# Patient Record
Sex: Male | Born: 1935 | Race: White | Hispanic: No | Smoking: Former smoker
Health system: Southern US, Community
[De-identification: ages and names within clinical notes are randomized; demographics above are authoritative.]

## PROBLEM LIST (undated history)

## (undated) DIAGNOSIS — E785 Hyperlipidemia, unspecified: Secondary | ICD-10-CM

## (undated) DIAGNOSIS — I2510 Atherosclerotic heart disease of native coronary artery without angina pectoris: Secondary | ICD-10-CM

## (undated) DIAGNOSIS — I00 Rheumatic fever without heart involvement: Secondary | ICD-10-CM

## (undated) DIAGNOSIS — C649 Malignant neoplasm of unspecified kidney, except renal pelvis: Secondary | ICD-10-CM

## (undated) DIAGNOSIS — R011 Cardiac murmur, unspecified: Secondary | ICD-10-CM

## (undated) DIAGNOSIS — I10 Essential (primary) hypertension: Secondary | ICD-10-CM

## (undated) DIAGNOSIS — N400 Enlarged prostate without lower urinary tract symptoms: Secondary | ICD-10-CM

## (undated) DIAGNOSIS — E46 Unspecified protein-calorie malnutrition: Secondary | ICD-10-CM

## (undated) DIAGNOSIS — Z8739 Personal history of other diseases of the musculoskeletal system and connective tissue: Secondary | ICD-10-CM

## (undated) DIAGNOSIS — R739 Hyperglycemia, unspecified: Secondary | ICD-10-CM

## (undated) DIAGNOSIS — E119 Type 2 diabetes mellitus without complications: Secondary | ICD-10-CM

## (undated) HISTORY — PX: VASECTOMY: SHX75

## (undated) HISTORY — DX: Essential (primary) hypertension: I10

## (undated) HISTORY — DX: Atherosclerotic heart disease of native coronary artery without angina pectoris: I25.10

## (undated) HISTORY — DX: Rheumatic fever without heart involvement: I00

## (undated) HISTORY — PX: TONSILLECTOMY AND ADENOIDECTOMY: SUR1326

## (undated) HISTORY — DX: Hyperlipidemia, unspecified: E78.5

## (undated) HISTORY — DX: Benign prostatic hyperplasia without lower urinary tract symptoms: N40.0

## (undated) HISTORY — DX: Malignant neoplasm of unspecified kidney, except renal pelvis: C64.9

## (undated) HISTORY — DX: Unspecified protein-calorie malnutrition: E46

## (undated) HISTORY — DX: Hyperglycemia, unspecified: R73.9

---

## 1989-01-17 HISTORY — PX: CORONARY ARTERY BYPASS GRAFT: SHX141

## 1989-01-17 HISTORY — PX: CARDIAC CATHETERIZATION: SHX172

## 1997-01-17 DIAGNOSIS — C649 Malignant neoplasm of unspecified kidney, except renal pelvis: Secondary | ICD-10-CM

## 1997-01-17 HISTORY — DX: Malignant neoplasm of unspecified kidney, except renal pelvis: C64.9

## 1997-01-17 HISTORY — PX: NEPHRECTOMY: SHX65

## 1997-12-30 ENCOUNTER — Inpatient Hospital Stay (HOSPITAL_COMMUNITY): Admission: RE | Admit: 1997-12-30 | Discharge: 1998-01-01 | Payer: Self-pay | Admitting: Urology

## 1998-09-18 HISTORY — PX: CATARACT EXTRACTION W/ INTRAOCULAR LENS  IMPLANT, BILATERAL: SHX1307

## 1999-06-23 ENCOUNTER — Encounter: Admission: RE | Admit: 1999-06-23 | Discharge: 1999-06-23 | Payer: Self-pay | Admitting: Urology

## 1999-06-23 ENCOUNTER — Encounter: Payer: Self-pay | Admitting: Urology

## 2000-06-19 ENCOUNTER — Encounter: Payer: Self-pay | Admitting: Urology

## 2000-06-19 ENCOUNTER — Encounter: Admission: RE | Admit: 2000-06-19 | Discharge: 2000-06-19 | Payer: Self-pay | Admitting: Urology

## 2001-06-18 ENCOUNTER — Encounter: Admission: RE | Admit: 2001-06-18 | Discharge: 2001-06-18 | Payer: Self-pay | Admitting: Urology

## 2001-06-18 ENCOUNTER — Encounter: Payer: Self-pay | Admitting: Urology

## 2002-07-04 ENCOUNTER — Encounter: Payer: Self-pay | Admitting: Urology

## 2002-07-04 ENCOUNTER — Encounter: Admission: RE | Admit: 2002-07-04 | Discharge: 2002-07-04 | Payer: Self-pay | Admitting: Urology

## 2004-10-01 ENCOUNTER — Ambulatory Visit: Payer: Self-pay | Admitting: Hematology & Oncology

## 2004-11-19 ENCOUNTER — Ambulatory Visit: Payer: Self-pay | Admitting: Hematology & Oncology

## 2008-01-18 HISTORY — PX: COLONOSCOPY: SHX174

## 2009-07-14 HISTORY — PX: US ECHOCARDIOGRAPHY: HXRAD669

## 2010-10-12 ENCOUNTER — Ambulatory Visit (INDEPENDENT_AMBULATORY_CARE_PROVIDER_SITE_OTHER): Payer: Medicare Other | Admitting: Family Medicine

## 2010-10-12 ENCOUNTER — Encounter: Payer: Self-pay | Admitting: Family Medicine

## 2010-10-12 VITALS — BP 128/82 | HR 74 | Temp 97.9°F | Ht 67.75 in | Wt 210.0 lb

## 2010-10-12 DIAGNOSIS — R739 Hyperglycemia, unspecified: Secondary | ICD-10-CM

## 2010-10-12 DIAGNOSIS — R7309 Other abnormal glucose: Secondary | ICD-10-CM

## 2010-10-12 DIAGNOSIS — I2510 Atherosclerotic heart disease of native coronary artery without angina pectoris: Secondary | ICD-10-CM

## 2010-10-12 DIAGNOSIS — I10 Essential (primary) hypertension: Secondary | ICD-10-CM

## 2010-10-12 DIAGNOSIS — E785 Hyperlipidemia, unspecified: Secondary | ICD-10-CM

## 2010-10-12 NOTE — Progress Notes (Signed)
  Subjective:    Patient ID: John Potter, male    DOB: Oct 06, 1934, 76 y.o.   MRN: [id number]  HPI 76 yr old male to establish with [place]. He had seen Dr. Renne Crigler in the past for primary care, but he has not seen him for about 5 years. He sees Dr. Rachelle Hora Croitoru twice a year for Cardiology exams. He sees Dr. Larey Dresser once a year for Urology exams. He sees Dr. Sherrie Mustache once a year at the [facility] clinic in [place] so he can get his prescriptions filled there. He feels fine today and he has no concerns. His most recent labs from 06-25-10 show a fasting glucose of 110.    Review of Systems  Constitutional: Negative.   Respiratory: Negative.   Cardiovascular: Negative.   Gastrointestinal: Negative.   Genitourinary: Negative.        Objective:   Physical Exam  Constitutional: He appears well-developed and well-nourished.  Neck: Neck supple. No thyromegaly present.  Cardiovascular: Normal rate, regular rhythm, normal heart sounds and intact distal pulses.  Exam reveals no gallop and no friction rub.        Has a 2/6 SM over the mitral area   Pulmonary/Chest: Effort normal and breath sounds normal. No respiratory distress. He has no wheezes. He has no rales. He exhibits no tenderness.  Lymphadenopathy:    He has no cervical adenopathy.          Assessment & Plan:  Intro visit for this patient. We will get records sent from his specialists.

## 2011-06-28 ENCOUNTER — Encounter: Payer: Self-pay | Admitting: Family Medicine

## 2011-06-28 ENCOUNTER — Ambulatory Visit (INDEPENDENT_AMBULATORY_CARE_PROVIDER_SITE_OTHER): Payer: Medicare Other | Admitting: Family Medicine

## 2011-06-28 VITALS — BP 142/70 | HR 65 | Temp 98.3°F | Wt 207.0 lb

## 2011-06-28 DIAGNOSIS — M109 Gout, unspecified: Secondary | ICD-10-CM

## 2011-06-28 MED ORDER — METHYLPREDNISOLONE ACETATE 80 MG/ML IJ SUSP
120.0000 mg | Freq: Once | INTRAMUSCULAR | Status: AC
  Administered 2011-06-28: 120 mg via INTRAMUSCULAR

## 2011-06-28 NOTE — Progress Notes (Signed)
Addended by: Aniceto Boss A on: 06/28/2011 10:11 AM   Modules accepted: Orders

## 2011-06-28 NOTE — Progress Notes (Signed)
  Subjective:    Patient ID: John Potter, male    DOB: [birth year], 77 y.o.   MRN: [id number]  HPI Here for the sudden onset 5 days ago of swelling and pain in the left foot. No recent trauma. He has never had this before. His brother has had gout for years.    Review of Systems  Constitutional: Negative.   Musculoskeletal: Positive for joint swelling and arthralgias.       Objective:   Physical Exam  Constitutional: He appears well-developed and well-nourished.  Musculoskeletal:       The left great toe is red, swollen, warm, and quite tender over the MTP          Assessment & Plan:  This is gout. We discussed its origin, how diet relates to it, etc. Recheck prn

## 2011-11-29 ENCOUNTER — Telehealth: Payer: Self-pay | Admitting: Family Medicine

## 2011-11-29 NOTE — Telephone Encounter (Signed)
That's fine. We will check it at his cpx

## 2011-11-29 NOTE — Telephone Encounter (Signed)
I spoke with pt  

## 2011-11-29 NOTE — Telephone Encounter (Signed)
Pt calling to schedule appt for annual cpx and states that his hemoglobin has been running low.  Apparently he went to donate blood twice this week and was turned away because hemoglobin was too low.  Pt states he is not have any symptoms and would like to wait until he comes in to see Dr. Clent Ridges for his cpx to discuss.

## 2012-01-16 ENCOUNTER — Other Ambulatory Visit (INDEPENDENT_AMBULATORY_CARE_PROVIDER_SITE_OTHER): Payer: Medicare Other

## 2012-01-16 DIAGNOSIS — D649 Anemia, unspecified: Secondary | ICD-10-CM

## 2012-01-16 DIAGNOSIS — Z0000 Encounter for general adult medical examination without abnormal findings: Secondary | ICD-10-CM

## 2012-01-16 LAB — BASIC METABOLIC PANEL
BUN: 24 mg/dL — ABNORMAL HIGH (ref 6–23)
CO2: 27 mEq/L (ref 19–32)
Calcium: 8.5 mg/dL (ref 8.4–10.5)
GFR: 52.01 mL/min — ABNORMAL LOW (ref >60.00)
Glucose, Bld: 123 mg/dL — ABNORMAL HIGH (ref 70–99)
Potassium: 4.3 mEq/L (ref 3.5–5.1)

## 2012-01-16 LAB — HEPATIC FUNCTION PANEL
AST: 21 U/L (ref 0–37)
Albumin: 3 g/dL — ABNORMAL LOW (ref 3.5–5.2)
Total Bilirubin: 0.4 mg/dL (ref 0.3–1.2)

## 2012-01-16 LAB — LIPID PANEL
Cholesterol: 120 mg/dL (ref 0–200)
HDL: 38.4 mg/dL — ABNORMAL LOW (ref >39.00)
LDL Cholesterol: 71 mg/dL (ref 0–99)
VLDL: 10.2 mg/dL (ref 0.0–40.0)

## 2012-01-16 LAB — POCT URINALYSIS DIPSTICK
Ketones, UA: NEGATIVE
Protein, UA: NEGATIVE
Spec Grav, UA: 1.01
pH, UA: 5.5

## 2012-01-16 LAB — TSH: TSH: 1.63 u[IU]/mL (ref 0.35–5.50)

## 2012-01-17 LAB — CBC WITH DIFFERENTIAL/PLATELET
Basophils Absolute: 0.1 10*3/uL (ref 0.0–0.1)
Eosinophils Absolute: 0.2 10*3/uL (ref 0.0–0.7)
HCT: 33 % — ABNORMAL LOW (ref 39.0–52.0)
Lymphs Abs: 1.7 10*3/uL (ref 0.7–4.0)
MCHC: 31.7 g/dL (ref 30.0–36.0)
Monocytes Absolute: 0.7 10*3/uL (ref 0.1–1.0)
Monocytes Relative: 8.6 % (ref 3.0–12.0)
Neutro Abs: 5 10*3/uL (ref 1.4–7.7)
Platelets: 233 10*3/uL (ref 150.0–400.0)
RDW: 17.1 % — ABNORMAL HIGH (ref 11.5–14.6)

## 2012-01-23 ENCOUNTER — Ambulatory Visit (INDEPENDENT_AMBULATORY_CARE_PROVIDER_SITE_OTHER): Payer: Medicare Other | Admitting: Family Medicine

## 2012-01-23 ENCOUNTER — Encounter: Payer: Self-pay | Admitting: Family Medicine

## 2012-01-23 VITALS — BP 142/76 | HR 73 | Temp 98.0°F | Ht 68.0 in | Wt 217.0 lb

## 2012-01-23 DIAGNOSIS — D649 Anemia, unspecified: Secondary | ICD-10-CM

## 2012-01-23 DIAGNOSIS — Z0000 Encounter for general adult medical examination without abnormal findings: Secondary | ICD-10-CM

## 2012-01-23 LAB — FERRITIN: Ferritin: 48.8 ng/mL (ref 22.0–322.0)

## 2012-01-23 LAB — IRON: Iron: 19 ug/dL — ABNORMAL LOW (ref 42–165)

## 2012-01-23 NOTE — Progress Notes (Signed)
  Subjective:    Patient ID: John Potter, male    DOB: January 30, 1934, 78 y.o.   MRN: [id number]  HPI 78 yr old male for a cpx. He feels well and has no concerns. However his recent labs show an anemia with a Hgb of 10 (down from 14 a year ago). The indices were normal but they were on the verge of being microcytic.    Review of Systems  Constitutional: Negative.   HENT: Negative.   Eyes: Negative.   Respiratory: Negative.   Cardiovascular: Negative.   Gastrointestinal: Negative.   Genitourinary: Negative.   Musculoskeletal: Negative.   Skin: Negative.   Neurological: Negative.   Hematological: Negative.   Psychiatric/Behavioral: Negative.        Objective:   Physical Exam  Constitutional: He is oriented to person, place, and time. He appears well-developed and well-nourished. No distress.  HENT:  Head: Normocephalic and atraumatic.  Right Ear: External ear normal.  Left Ear: External ear normal.  Nose: Nose normal.  Mouth/Throat: Oropharynx is clear and moist. No oropharyngeal exudate.  Eyes: Conjunctivae normal and EOM are normal. Pupils are equal, round, and reactive to light. Right eye exhibits no discharge. Left eye exhibits no discharge. No scleral icterus.  Neck: Neck supple. No JVD present. No tracheal deviation present. No thyromegaly present.  Cardiovascular: Normal rate, regular rhythm and intact distal pulses.  Exam reveals no gallop and no friction rub.   Murmur heard.      Stable 2/6 SM over the mitral area   Pulmonary/Chest: Effort normal and breath sounds normal. No respiratory distress. He has no wheezes. He has no rales. He exhibits no tenderness.  Abdominal: Soft. Bowel sounds are normal. He exhibits no distension and no mass. There is no tenderness. There is no rebound and no guarding.  Genitourinary: Rectum normal, prostate normal and penis normal. Guaiac negative stool. No penile tenderness.  Musculoskeletal: Normal range of motion. He exhibits no edema and  no tenderness.  Lymphadenopathy:    He has no cervical adenopathy.  Neurological: He is alert and oriented to person, place, and time. He has normal reflexes. No cranial nerve deficit. He exhibits normal muscle tone. Coordination normal.  Skin: Skin is warm and dry. No rash noted. He is not diaphoretic. No erythema. No pallor.  Psychiatric: He has a normal mood and affect. His behavior is normal. Judgment and thought content normal.          Assessment & Plan:  Well exam. His anemia is likely due to low iron so we will get some labs today and we will go from there.

## 2012-01-23 NOTE — Progress Notes (Signed)
Quick Note:  Pt is here now for CPE and we will go over then. ______

## 2012-01-26 ENCOUNTER — Encounter: Payer: Self-pay | Admitting: Family Medicine

## 2012-01-26 MED ORDER — FERROUS SULFATE 325 (65 FE) MG PO TABS: 325.0000 mg | ORAL_TABLET | Freq: Two times a day (BID) | ORAL | Status: DC

## 2012-01-26 NOTE — Addendum Note (Signed)
Addended by: Aniceto Boss A on: 01/26/2012 11:56 AM   Modules accepted: Orders

## 2012-01-26 NOTE — Progress Notes (Signed)
Quick Note:  I left voice message with results, put a copy in mail and sent script e-scribe. ______

## 2012-04-02 ENCOUNTER — Ambulatory Visit (INDEPENDENT_AMBULATORY_CARE_PROVIDER_SITE_OTHER): Payer: Medicare Other | Admitting: Family Medicine

## 2012-04-02 ENCOUNTER — Encounter: Payer: Self-pay | Admitting: Family Medicine

## 2012-04-02 VITALS — BP 142/70 | HR 66 | Temp 97.8°F | Wt 218.0 lb

## 2012-04-02 DIAGNOSIS — H9192 Unspecified hearing loss, left ear: Secondary | ICD-10-CM

## 2012-04-02 DIAGNOSIS — H9190 Unspecified hearing loss, unspecified ear: Secondary | ICD-10-CM

## 2012-04-02 DIAGNOSIS — D509 Iron deficiency anemia, unspecified: Secondary | ICD-10-CM

## 2012-04-02 LAB — CBC WITH DIFFERENTIAL/PLATELET
Basophils Relative: 0.8 % (ref 0.0–3.0)
Eosinophils Absolute: 0.1 10*3/uL (ref 0.0–0.7)
HCT: 35.3 % — ABNORMAL LOW (ref 39.0–52.0)
Lymphs Abs: 1.8 10*3/uL (ref 0.7–4.0)
MCHC: 31.9 g/dL (ref 30.0–36.0)
MCV: 77.4 fl — ABNORMAL LOW (ref 78.0–100.0)
Monocytes Absolute: 0.8 10*3/uL (ref 0.1–1.0)
Neutrophils Relative %: 66.8 % (ref 43.0–77.0)
RBC: 4.56 Mil/uL (ref 4.22–5.81)

## 2012-04-02 NOTE — Progress Notes (Signed)
  Subjective:    Patient ID: John Potter, male    DOB: 1934/03/17, 78 y.o.   MRN: [id number]  HPI Here for 2 things. First he has noticed the slow loss of hearing in the left ear for several months. No pain or ringing or dizziness. The right side seems fine. He has a family hx of hearing loss including his father and sister. Also he wants to check his anemia again. He has been taking iron supplements for 2 and 1/2 months now. He feels the same as always.   Review of Systems  Constitutional: Negative.   HENT: Positive for hearing loss. Negative for ear pain, congestion, sinus pressure, tinnitus and ear discharge.        Objective:   Physical Exam  Constitutional: He is oriented to person, place, and time. He appears well-developed and well-nourished.  HENT:  Head: Normocephalic and atraumatic.  Right Ear: External ear normal.  Left Ear: External ear normal.  Nose: Nose normal.  Mouth/Throat: Oropharynx is clear and moist.  Eyes: Conjunctivae are normal. Pupils are equal, round, and reactive to light.  Neck: Neck supple. No thyromegaly present.  Lymphadenopathy:    He has no cervical adenopathy.  Neurological: He is alert and oriented to person, place, and time.          Assessment & Plan:  He probably has sensorineural hearing loss. I advised him to see an audiologist for a full evaluation. We will check a CBC today.

## 2012-04-03 NOTE — Progress Notes (Signed)
Quick Note:  I spoke with pt ______ 

## 2012-04-09 ENCOUNTER — Encounter: Payer: Self-pay | Admitting: Family Medicine

## 2012-04-09 ENCOUNTER — Ambulatory Visit (INDEPENDENT_AMBULATORY_CARE_PROVIDER_SITE_OTHER): Payer: Medicare Other | Admitting: Family Medicine

## 2012-04-09 VITALS — BP 162/70 | HR 78 | Temp 99.0°F | Wt 221.0 lb

## 2012-04-09 DIAGNOSIS — R42 Dizziness and giddiness: Secondary | ICD-10-CM

## 2012-04-09 DIAGNOSIS — H9190 Unspecified hearing loss, unspecified ear: Secondary | ICD-10-CM

## 2012-04-09 DIAGNOSIS — H9192 Unspecified hearing loss, left ear: Secondary | ICD-10-CM

## 2012-04-09 MED ORDER — PREDNISONE 10 MG PO TABS: ORAL_TABLET | ORAL | Status: DC

## 2012-04-09 NOTE — Progress Notes (Signed)
  Subjective:    Patient ID: John Potter, male    DOB: Dec 14, 1934, 78 y.o.   MRN: [id number]  HPI Here with continued left sided hearing loss but now also with dizziness. This started over the weekend with intermittent sensations of the room spinning. No sinus symptoms.    Review of Systems  Constitutional: Negative.   HENT: Positive for hearing loss and tinnitus. Negative for ear pain, congestion, sinus pressure and ear discharge.   Eyes: Negative.   Neurological: Positive for dizziness. Negative for tremors, seizures, syncope, facial asymmetry, speech difficulty, weakness, light-headedness, numbness and headaches.       Objective:   Physical Exam  Constitutional: He is oriented to person, place, and time. He appears well-developed and well-nourished.  HENT:  Right Ear: External ear normal.  Left Ear: External ear normal.  Nose: Nose normal.  Mouth/Throat: Oropharynx is clear and moist.  Eyes: Conjunctivae are normal. Pupils are equal, round, and reactive to light.  Lymphadenopathy:    He has no cervical adenopathy.  Neurological: He is alert and oriented to person, place, and time.          Assessment & Plan:  Unilateral hearing loss now with vertigo, possible [place]ie[place]. Given a prednisone taper, and we will refer to ENT.

## 2012-06-29 ENCOUNTER — Other Ambulatory Visit: Payer: Self-pay | Admitting: *Deleted

## 2012-06-29 ENCOUNTER — Telehealth: Payer: Self-pay | Admitting: *Deleted

## 2012-06-29 DIAGNOSIS — E785 Hyperlipidemia, unspecified: Secondary | ICD-10-CM

## 2012-06-29 DIAGNOSIS — E782 Mixed hyperlipidemia: Secondary | ICD-10-CM

## 2012-06-29 DIAGNOSIS — Z79899 Other long term (current) drug therapy: Secondary | ICD-10-CM

## 2012-06-29 LAB — COMPREHENSIVE METABOLIC PANEL
ALT: 13 U/L (ref 0–53)
AST: 17 U/L (ref 0–37)
Albumin: 4.2 g/dL (ref 3.5–5.2)
CO2: 25 mEq/L (ref 19–32)
Calcium: 9.3 mg/dL (ref 8.4–10.5)
Chloride: 106 mEq/L (ref 96–112)
Creat: 1.4 mg/dL — ABNORMAL HIGH (ref 0.50–1.35)
Potassium: 5.1 mEq/L (ref 3.5–5.3)

## 2012-06-29 LAB — LIPID PANEL: Cholesterol: 123 mg/dL (ref 0–200)

## 2012-06-29 NOTE — Telephone Encounter (Signed)
Patient walk-in to office to get labs done before office visit CMP and Lipids

## 2012-06-30 ENCOUNTER — Encounter: Payer: Self-pay | Admitting: *Deleted

## 2012-07-02 ENCOUNTER — Telehealth: Payer: Self-pay | Admitting: *Deleted

## 2012-07-02 NOTE — Telephone Encounter (Signed)
LMOM of stable lab results.

## 2012-07-02 NOTE — Telephone Encounter (Signed)
Message copied by Vita Barley on Mon Jul 02, 2012  8:51 AM ------      Message from: Thurmon Fair      Created: Sat Jun 30, 2012  8:25 AM       Labs are in normal range except borderline kidney function (not a new problem) ------

## 2012-07-04 ENCOUNTER — Encounter: Payer: Self-pay | Admitting: Cardiovascular Disease

## 2012-07-05 ENCOUNTER — Ambulatory Visit (INDEPENDENT_AMBULATORY_CARE_PROVIDER_SITE_OTHER): Payer: Medicare Other | Admitting: Cardiovascular Disease

## 2012-07-05 VITALS — BP 134/70 | HR 54 | Resp 16 | Ht 69.0 in | Wt 228.3 lb

## 2012-07-05 DIAGNOSIS — I2510 Atherosclerotic heart disease of native coronary artery without angina pectoris: Secondary | ICD-10-CM

## 2012-07-05 DIAGNOSIS — I447 Left bundle-branch block, unspecified: Secondary | ICD-10-CM

## 2012-07-05 DIAGNOSIS — E785 Hyperlipidemia, unspecified: Secondary | ICD-10-CM

## 2012-07-05 DIAGNOSIS — R011 Cardiac murmur, unspecified: Secondary | ICD-10-CM

## 2012-07-05 DIAGNOSIS — E669 Obesity, unspecified: Secondary | ICD-10-CM

## 2012-07-05 DIAGNOSIS — I25810 Atherosclerosis of coronary artery bypass graft(s) without angina pectoris: Secondary | ICD-10-CM

## 2012-07-05 DIAGNOSIS — I10 Essential (primary) hypertension: Secondary | ICD-10-CM

## 2012-07-05 NOTE — Patient Instructions (Signed)
Your physician encouraged you to lose weight for better health. Your physician discussed the importance of regular exercise and recommended that you start or continue a regular exercise program for good health.  Your physician recommends that you schedule a follow-up appointment in: 1 year

## 2012-07-08 ENCOUNTER — Encounter: Payer: Self-pay | Admitting: Cardiovascular Disease

## 2012-07-08 DIAGNOSIS — E785 Hyperlipidemia, unspecified: Secondary | ICD-10-CM | POA: Insufficient documentation

## 2012-07-08 DIAGNOSIS — I447 Left bundle-branch block, unspecified: Secondary | ICD-10-CM | POA: Insufficient documentation

## 2012-07-08 DIAGNOSIS — I10 Essential (primary) hypertension: Secondary | ICD-10-CM | POA: Insufficient documentation

## 2012-07-08 DIAGNOSIS — E669 Obesity, unspecified: Secondary | ICD-10-CM | POA: Insufficient documentation

## 2012-07-08 DIAGNOSIS — R011 Cardiac murmur, unspecified: Secondary | ICD-10-CM | POA: Insufficient documentation

## 2012-07-08 DIAGNOSIS — I2510 Atherosclerotic heart disease of native coronary artery without angina pectoris: Secondary | ICD-10-CM | POA: Insufficient documentation

## 2012-07-08 NOTE — Assessment & Plan Note (Signed)
Well controlled 

## 2012-07-08 NOTE — Assessment & Plan Note (Signed)
Despite his active lifestyle he remains asymptomatic. He describes his angina on exertion in the past as a sensation of "something cold air into his lungs". This has not recurred since surgery he has preserved left ventricular systolic function with an ejection fraction of 55% by echo in 2011

## 2012-07-08 NOTE — Assessment & Plan Note (Signed)
He has gained weight since his previous appointment. He is encouraged to pay more attention to his diet and continue with more frequent physical activity.

## 2012-07-08 NOTE — Assessment & Plan Note (Signed)
Satisfactory lipid profile on current regimen. Note mild fasting hyperglycemia suggestive of a tendency to develop diabetes mellitus. Weight loss is strongly encouraged.

## 2012-07-08 NOTE — Assessment & Plan Note (Signed)
Together with a tendency to sinus bradycardia this is an marker of conduction system disease. Negative chronotropic agents such as beta blockers should be used with caution

## 2012-07-08 NOTE — Progress Notes (Signed)
Patient ID: John Potter   DOB: 1934/05/30, 78 y.o.   MRN: [id number]      Reason for office visit Coronary artery disease followup  John Potter has done well from a cardiovascular standpoint since his last visit a year ago. He also has adjusted to the loss of his wife and seems to have mostly come out of the grieving process. Unfortunately he has become more sedentary than he was a year ago. This has led to weight gain. He noticed this when he received notification of his upcoming appointment and recently has started walking again. Yesterday he walk for 2 miles in about 35 minutes and had no complaints of shortness of breath chest pain intermittent claudication for dizziness.  He donates blood on a routine basis but was turned down from donation recently because of anemia. After starting iron supplements his hemoglobin has rebounded and he is now able to donate blood again.    he brings a very detailed log of his heart rate and blood pressure. Typically his heart rate is between 53 and 70 beats per minute and his typical blood pressure is in the 120s to 130s over 50s to 60s.    Allergies  Allergen Reactions  . Amoxicillin     rash    Current Outpatient Prescriptions  Medication Sig Dispense Refill  . aspirin 81 MG tablet Take 81 mg by mouth daily.        . calcium carbonate (OS-CAL) 600 MG TABS Take 600 mg by mouth daily. Vit D 3       . ferrous sulfate 325 (65 FE) MG tablet Take 1 tablet (325 mg total) by mouth 2 (two) times daily.  60 tablet  11  . fish oil-omega-3 fatty acids 1000 MG capsule Take 2 g by mouth daily.        Marland Kitchen lisinopril (PRINIVIL,ZESTRIL) 40 MG tablet Take 40 mg by mouth daily.        . metoprolol tartrate (LOPRESSOR) 25 MG tablet Take 25 mg by mouth 2 (two) times daily.        . Multiple Vitamin (MULTIVITAMIN) tablet Take 1 tablet by mouth daily.        . Multiple Vitamins-Minerals (PRESERVISION/LUTEIN) CAPS Take by mouth daily. Take 2 capsules every day      .  niacin (NIASPAN) 500 MG CR tablet Take 500 mg by mouth at bedtime.        . simvastatin (ZOCOR) 20 MG tablet Take 20 mg by mouth at bedtime.        . Tamsulosin HCl (FLOMAX) 0.4 MG CAPS Take 0.4 mg by mouth daily.      . vitamin C (ASCORBIC ACID) 500 MG tablet Take 1,000 mg by mouth daily.       . predniSONE (DELTASONE) 10 MG tablet Take 4 pills a day for 4 days, then 3 a day for 4 days, then 2 a day for 4 days ,then 1 a day for 4 days, then stop  40 tablet  0   No current facility-administered medications for this visit.    Past Medical History  Diagnosis Date  . CAD (coronary artery disease)     sees Dr. Rachelle Hora Kamyla Potter   . Hypertension   . Hyperlipidemia   . Chronic kidney disease   . Rheumatic fever   . Kidney carcinoma   . Hyperglycemia   . BPH (benign prostatic hyperplasia)     sees Dr. Mena Potter     Past Surgical History  Procedure Laterality Date  . Coronary artery bypass graft  1991    3 way CABG per Dr. Andrey Potter   . Nephrectomy  1999    left kidney, per Dr. Larey Potter   . Vasectomy    . Tonsillectomy and adenoidectomy    . Colonoscopy  2010    clear, repeat in 10 yrs   . US echocardiography  07/14/2009    mod-severe LVH,EF =>55%,LA mildly dilated,mild mitral & aortic annular ca+, AOV mod. sclerotic,discrete nodular thickening of the non-coronary cusp    Family History  Problem Relation Age of Onset  . Heart disease Father   . Hyperlipidemia Father   . Hypertension Father   . Diabetes Father     History   Social History  . Marital Status: Married    Spouse Name: N/A    Number of Children: N/A  . Years of Education: N/A   Occupational History  . Not on file.   Social History Main Topics  . Smoking status: Never Smoker   . Smokeless tobacco: Never Used  . Alcohol Use: No  . Drug Use: No  . Sexually Active: Not on file   Other Topics Concern  . Not on file   Social History Narrative  . No narrative on file    Review of systems: The patient  specifically denies any chest pain at rest or with exertion, dyspnea at rest or with exertion, orthopnea, paroxysmal nocturnal dyspnea, syncope, palpitations, focal neurological deficits, intermittent claudication, lower extremity edema, unexplained weight gain, cough, hemoptysis or wheezing.  The patient also denies abdominal pain, nausea, vomiting, dysphagia, diarrhea, constipation, polyuria, polydipsia, dysuria, hematuria, frequency, urgency, abnormal bleeding or bruising, fever, chills, unexpected weight changes, mood swings, change in skin or hair texture, change in voice quality, auditory or visual problems, allergic reactions or rashes, new musculoskeletal complaints other than usual "aches and pains".   PHYSICAL EXAM BP 134/70  Pulse 54  Resp 16  Ht 5\' 9"  (1.753 m)  Wt 228 lb 4.8 oz (103.556 kg)  BMI 33.7 kg/m2  General: Alert, oriented x3, no distress Head: no evidence of trauma, PERRL, EOMI, no exophtalmos or lid lag, no myxedema, no xanthelasma; normal ears, nose and oropharynx Neck: normal jugular venous pulsations and no hepatojugular reflux; brisk carotid pulses without delay and no carotid bruits Chest: clear to auscultation, no signs of consolidation by percussion or palpation, normal fremitus, symmetrical and full respiratory excursions Cardiovascular: normal position and quality of the apical impulse, regular rhythm, normal first and paradoxically split second heart sounds, grade 2-3/6 early peaking crescendo decrescendo systolic murmur in the aortic focus, rubs or gallops Abdomen: no tenderness or distention, no masses by palpation, no abnormal pulsatility or arterial bruits, normal bowel sounds, no hepatosplenomegaly Extremities: no clubbing, cyanosis or edema; 2+ radial, ulnar and brachial pulses bilaterally; 2+ right femoral, posterior tibial and dorsalis pedis pulses; 2+ left femoral, posterior tibial and dorsalis pedis pulses; no subclavian or femoral bruits Neurological:  grossly nonfocal   EKG: Sinus bradycardia left bundle branch block  Lipid Panel     Component Value Date/Time   CHOL 123 06/29/2012 0818   TRIG 103 06/29/2012 0818   HDL 39* 06/29/2012 0818   CHOLHDL 3.2 06/29/2012 0818   VLDL 21 06/29/2012 0818   LDLCALC 63 06/29/2012 0818    BMET    Component Value Date/Time   NA 145 06/29/2012 0818   K 5.1 06/29/2012 0818   CL 106 06/29/2012 0818   CO2 25 06/29/2012 0818  GLUCOSE 118* 06/29/2012 0818   BUN 28* 06/29/2012 0818   CREATININE 1.40* 06/29/2012 0818   CREATININE 1.4 01/16/2012 0809   CALCIUM 9.3 06/29/2012 0818     ASSESSMENT AND PLAN CAD (coronary artery disease) Despite his active lifestyle he remains asymptomatic. He describes his angina on exertion in the past as a sensation of "something cold air into his lungs". This has not recurred since surgery he has preserved left ventricular systolic function with an ejection fraction of 55% by echo in 2011  Essential hypertension Well-controlled  LBBB (left bundle branch block) Together with a tendency to sinus bradycardia this is an marker of conduction system disease. Negative chronotropic agents such as beta blockers should be used with caution  Obesity (BMI 30-39.9) He has gained weight since his previous appointment. He is encouraged to pay more attention to his diet and continue with more frequent physical activity.  Hyperlipidemia Satisfactory lipid profile on current regimen. Note mild fasting hyperglycemia suggestive of a tendency to develop diabetes mellitus. Weight loss is strongly encouraged.  Orders Placed This Encounter  Procedures  . EKG 12-Lead   No orders of the defined types were placed in this encounter.    Junious Silk, MD, [facility] [phone] office [phone] pager

## 2012-08-08 ENCOUNTER — Telehealth: Payer: Self-pay | Admitting: Cardiovascular Disease

## 2012-08-08 NOTE — Telephone Encounter (Signed)
Having eye surgery on 7-29-His doctor wants to know if he can stop his aspirin for 5 days before his surgery? Please call and let him know today.

## 2012-08-08 NOTE — Telephone Encounter (Signed)
Yes, OK to temporarily hold aspirin for his procedure

## 2012-08-08 NOTE — Telephone Encounter (Signed)
Message forwarded to Dr. Croitoru.  

## 2012-08-08 NOTE — Telephone Encounter (Signed)
Returned call and informed pt per instructions by MD/PA.  Pt verbalized understanding and agreed w/ plan.  

## 2012-08-14 ENCOUNTER — Other Ambulatory Visit: Payer: Self-pay

## 2013-01-29 ENCOUNTER — Ambulatory Visit (INDEPENDENT_AMBULATORY_CARE_PROVIDER_SITE_OTHER): Payer: Medicare Other | Admitting: Family Medicine

## 2013-01-29 ENCOUNTER — Encounter: Payer: Self-pay | Admitting: Family Medicine

## 2013-01-29 VITALS — BP 144/78 | HR 70 | Temp 97.9°F | Ht 67.5 in | Wt 230.0 lb

## 2013-01-29 DIAGNOSIS — R7309 Other abnormal glucose: Secondary | ICD-10-CM

## 2013-01-29 DIAGNOSIS — Z0000 Encounter for general adult medical examination without abnormal findings: Secondary | ICD-10-CM

## 2013-01-29 DIAGNOSIS — E785 Hyperlipidemia, unspecified: Secondary | ICD-10-CM

## 2013-01-29 DIAGNOSIS — R739 Hyperglycemia, unspecified: Secondary | ICD-10-CM | POA: Insufficient documentation

## 2013-01-29 LAB — HEPATIC FUNCTION PANEL
ALK PHOS: 66 U/L (ref 39–117)
ALT: 20 U/L (ref 0–53)
AST: 23 U/L (ref 0–37)
Albumin: 3.8 g/dL (ref 3.5–5.2)
BILIRUBIN DIRECT: 0.1 mg/dL (ref 0.0–0.3)
BILIRUBIN TOTAL: 0.9 mg/dL (ref 0.3–1.2)
Total Protein: 7.4 g/dL (ref 6.0–8.3)

## 2013-01-29 LAB — POCT URINALYSIS DIPSTICK
BILIRUBIN UA: NEGATIVE
Glucose, UA: NEGATIVE
KETONES UA: NEGATIVE
Leukocytes, UA: NEGATIVE
Nitrite, UA: NEGATIVE
PH UA: 5
RBC UA: NEGATIVE
SPEC GRAV UA: 1.02
Urobilinogen, UA: 0.2

## 2013-01-29 LAB — CBC WITH DIFFERENTIAL/PLATELET
BASOS ABS: 0.1 10*3/uL (ref 0.0–0.1)
Basophils Relative: 0.7 % (ref 0.0–3.0)
EOS ABS: 0.1 10*3/uL (ref 0.0–0.7)
Eosinophils Relative: 1.8 % (ref 0.0–5.0)
HEMATOCRIT: 44.1 % (ref 39.0–52.0)
HEMOGLOBIN: 15.2 g/dL (ref 13.0–17.0)
LYMPHS ABS: 2.2 10*3/uL (ref 0.7–4.0)
Lymphocytes Relative: 27.1 % (ref 12.0–46.0)
MCHC: 34.4 g/dL (ref 30.0–36.0)
MCV: 93.9 fl (ref 78.0–100.0)
Monocytes Absolute: 0.6 10*3/uL (ref 0.1–1.0)
Monocytes Relative: 8.1 % (ref 3.0–12.0)
NEUTROS ABS: 4.9 10*3/uL (ref 1.4–7.7)
Neutrophils Relative %: 62.3 % (ref 43.0–77.0)
Platelets: 169 10*3/uL (ref 150.0–400.0)
RBC: 4.7 Mil/uL (ref 4.22–5.81)
RDW: 14.1 % (ref 11.5–14.6)
WBC: 7.9 10*3/uL (ref 4.5–10.5)

## 2013-01-29 LAB — BASIC METABOLIC PANEL
BUN: 24 mg/dL — ABNORMAL HIGH (ref 6–23)
CO2: 26 mEq/L (ref 19–32)
Calcium: 9.4 mg/dL (ref 8.4–10.5)
Chloride: 105 mEq/L (ref 96–112)
Creatinine, Ser: 1.4 mg/dL (ref 0.4–1.5)
GFR: 52.29 mL/min — AB (ref >60.00)
Glucose, Bld: 117 mg/dL — ABNORMAL HIGH (ref 70–99)
Potassium: 4.5 mEq/L (ref 3.5–5.1)
SODIUM: 138 meq/L (ref 135–145)

## 2013-01-29 LAB — HEMOGLOBIN A1C: Hgb A1c MFr Bld: 6.1 % (ref 4.6–6.5)

## 2013-01-29 LAB — LIPID PANEL
CHOLESTEROL: 141 mg/dL (ref 0–200)
HDL: 37.8 mg/dL — ABNORMAL LOW (ref >39.00)
LDL CALC: 76 mg/dL (ref 0–99)
Total CHOL/HDL Ratio: 4
Triglycerides: 134 mg/dL (ref 0.0–149.0)
VLDL: 26.8 mg/dL (ref 0.0–40.0)

## 2013-01-29 LAB — TSH: TSH: 2.11 u[IU]/mL (ref 0.35–5.50)

## 2013-01-29 MED ORDER — FERROUS SULFATE 325 (65 FE) MG PO TABS: 325.0000 mg | ORAL_TABLET | Freq: Every day | ORAL | Status: DC

## 2013-01-29 NOTE — Progress Notes (Signed)
Pre visit review using our clinic review tool, if applicable. No additional management support is needed unless otherwise documented below in the visit note. 

## 2013-01-29 NOTE — Progress Notes (Signed)
   Subjective:    Patient ID: John Potter, male    DOB: 1934-11-06, 79 y.o.   MRN: [id number]  HPI 79 yr old male for a cpx. He feels well and has no concerns. He still sees Dr. Sallyanne Kuster yearly and Dr. Junious Silk yearly. He sees the [place] twice a year to get his meds refilled.    Review of Systems  Constitutional: Negative.   HENT: Negative.   Eyes: Negative.   Respiratory: Negative.   Cardiovascular: Negative.   Gastrointestinal: Negative.   Genitourinary: Negative.   Musculoskeletal: Negative.   Skin: Negative.   Neurological: Negative.   Psychiatric/Behavioral: Negative.        Objective:   Physical Exam  Constitutional: He is oriented to person, place, and time. He appears well-developed and well-nourished. No distress.  HENT:  Head: Normocephalic and atraumatic.  Right Ear: External ear normal.  Left Ear: External ear normal.  Nose: Nose normal.  Mouth/Throat: Oropharynx is clear and moist. No oropharyngeal exudate.  Eyes: Conjunctivae and EOM are normal. Pupils are equal, round, and reactive to light. Right eye exhibits no discharge. Left eye exhibits no discharge. No scleral icterus.  Neck: Neck supple. No JVD present. No tracheal deviation present. No thyromegaly present.  Cardiovascular: Normal rate, regular rhythm, normal heart sounds and intact distal pulses.  Exam reveals no gallop and no friction rub.   No murmur heard. Pulmonary/Chest: Effort normal and breath sounds normal. No respiratory distress. He has no wheezes. He has no rales. He exhibits no tenderness.  Abdominal: Soft. Bowel sounds are normal. He exhibits no distension and no mass. There is no tenderness. There is no rebound and no guarding.  Genitourinary: Rectum normal.  Musculoskeletal: Normal range of motion. He exhibits no edema and no tenderness.  Lymphadenopathy:    He has no cervical adenopathy.  Neurological: He is alert and oriented to person, place, and time. He has normal reflexes. No cranial  nerve deficit. He exhibits normal muscle tone. Coordination normal.  Skin: Skin is warm and dry. No rash noted. He is not diaphoretic. No erythema. No pallor.  Psychiatric: He has a normal mood and affect. His behavior is normal. Judgment and thought content normal.          Assessment & Plan:  Well exam. Get labs

## 2013-04-01 ENCOUNTER — Other Ambulatory Visit: Payer: Self-pay | Admitting: Cardiovascular Disease

## 2013-04-01 ENCOUNTER — Telehealth: Payer: Self-pay | Admitting: *Deleted

## 2013-04-01 DIAGNOSIS — Z79899 Other long term (current) drug therapy: Secondary | ICD-10-CM

## 2013-04-01 DIAGNOSIS — E782 Mixed hyperlipidemia: Secondary | ICD-10-CM

## 2013-04-01 NOTE — Telephone Encounter (Signed)
Lab slips mailed to pt. For his next appt in June

## 2013-04-01 NOTE — Telephone Encounter (Signed)
Pt called in to get an appointment with Dr, Sallyanne Kuster. He needs to get lab work done prior to his June 9th appointment and asked that a lab slip be sent to him.  [place]

## 2013-06-19 LAB — LIPID PANEL
CHOL/HDL RATIO: 3.8 ratio
CHOLESTEROL: 134 mg/dL (ref 0–200)
HDL: 35 mg/dL — AB (ref >39)
LDL CALC: 76 mg/dL (ref 0–99)
TRIGLYCERIDES: 116 mg/dL (ref <150)
VLDL: 23 mg/dL (ref 0–40)

## 2013-06-19 LAB — COMPREHENSIVE METABOLIC PANEL
ALK PHOS: 54 U/L (ref 39–117)
ALT: 13 U/L (ref 0–53)
AST: 20 U/L (ref 0–37)
Albumin: 4 g/dL (ref 3.5–5.2)
BUN: 23 mg/dL (ref 6–23)
CALCIUM: 9.3 mg/dL (ref 8.4–10.5)
CO2: 23 mEq/L (ref 19–32)
Chloride: 101 mEq/L (ref 96–112)
Creat: 1.35 mg/dL (ref 0.50–1.35)
GLUCOSE: 128 mg/dL — AB (ref 70–99)
POTASSIUM: 4.6 meq/L (ref 3.5–5.3)
Sodium: 137 mEq/L (ref 135–145)
Total Bilirubin: 0.5 mg/dL (ref 0.2–1.2)
Total Protein: 6.7 g/dL (ref 6.0–8.3)

## 2013-06-19 LAB — HEPATIC FUNCTION PANEL
ALBUMIN: 4 g/dL (ref 3.5–5.2)
ALT: 13 U/L (ref 0–53)
AST: 19 U/L (ref 0–37)
Alkaline Phosphatase: 55 U/L (ref 39–117)
Bilirubin, Direct: 0.1 mg/dL (ref 0.0–0.3)
Indirect Bilirubin: 0.4 mg/dL (ref 0.2–1.2)
TOTAL PROTEIN: 6.8 g/dL (ref 6.0–8.3)
Total Bilirubin: 0.5 mg/dL (ref 0.2–1.2)

## 2013-06-25 ENCOUNTER — Encounter: Payer: Self-pay | Admitting: Cardiovascular Disease

## 2013-06-25 ENCOUNTER — Ambulatory Visit (INDEPENDENT_AMBULATORY_CARE_PROVIDER_SITE_OTHER): Payer: Medicare Other | Admitting: Cardiovascular Disease

## 2013-06-25 VITALS — BP 152/80 | HR 62 | Resp 16 | Ht 70.0 in | Wt 233.9 lb

## 2013-06-25 DIAGNOSIS — I447 Left bundle-branch block, unspecified: Secondary | ICD-10-CM

## 2013-06-25 DIAGNOSIS — E785 Hyperlipidemia, unspecified: Secondary | ICD-10-CM

## 2013-06-25 DIAGNOSIS — I10 Essential (primary) hypertension: Secondary | ICD-10-CM

## 2013-06-25 DIAGNOSIS — I2510 Atherosclerotic heart disease of native coronary artery without angina pectoris: Secondary | ICD-10-CM

## 2013-06-25 NOTE — Assessment & Plan Note (Addendum)
Despite the fact the surgery was performed in 1991, he remains completely asymptomatic following bypass surgery. His previous angina pectoris was described as a peculiar sensation of "controlling cold air into his lungs". He has not had it since the 1991 bypass surgery. He has preserved LV ejection fraction.

## 2013-06-25 NOTE — Assessment & Plan Note (Signed)
No clinical symptoms to suggest high-grade AV block. Avoid increasing the dose of beta blocker or adding other negative chronotropic drugs

## 2013-06-25 NOTE — Assessment & Plan Note (Signed)
Satisfactory LDL cholesterol level, but low HDL despite treatment with statin, niacin and omega-3 fatty acid supplements. The major way to solve this problem will be substantial weight loss. He has very prominent abdominal adiposity.

## 2013-06-25 NOTE — Patient Instructions (Signed)
Dr. Sallyanne Kuster recommends that you schedule a follow-up appointment in: ONE YEAR.  Your physician encouraged you to lose weight for better health.

## 2013-06-25 NOTE — Assessment & Plan Note (Signed)
His blood pressure is slightly elevated today, but consistently normal at home. No adjustments were made to his medications

## 2013-06-25 NOTE — Progress Notes (Signed)
Patient ID: John Potter, male   DOB: 09-20-1934, 79 y.o.   MRN: [id number]      Reason for office visit CAD status post CABG, hypertension,  It has been almost 25 years since [place] underwent bypass surgery and he continues to remain asymptomatic from a cardiac standpoint. He is slowly adjusting to life without his wife passed away about 2 years ago. He has no complaints of chest pain or dyspnea despite being physically active. He has had to slow down some because of his knee problems. He remains moderately obese with a BMI greater than 33. He checks his blood pressure periodically at home and usually in the 120s to 130s over 60s. His LDL cholesterol is low, but so is his HDL.   Allergies  Allergen Reactions  . Amoxicillin     rash    Current Outpatient Prescriptions  Medication Sig Dispense Refill  . aspirin 81 MG tablet Take 81 mg by mouth daily.        . calcium carbonate (OS-CAL) 600 MG TABS Take 600 mg by mouth daily. Vit D 3       . ferrous sulfate 325 (65 FE) MG tablet Take 1 tablet (325 mg total) by mouth daily with breakfast.  30 tablet  11  . fish oil-omega-3 fatty acids 1000 MG capsule Take 2 g by mouth daily.        Marland Kitchen lisinopril (PRINIVIL,ZESTRIL) 40 MG tablet Take 40 mg by mouth daily.        . metoprolol tartrate (LOPRESSOR) 25 MG tablet Take 25 mg by mouth 2 (two) times daily.        . Multiple Vitamin (MULTIVITAMIN) tablet Take 1 tablet by mouth daily.        . Multiple Vitamins-Minerals (PRESERVISION/LUTEIN) CAPS Take by mouth daily. Take 2 capsules every day      . niacin (NIASPAN) 500 MG CR tablet Take 500 mg by mouth at bedtime.        . simvastatin (ZOCOR) 20 MG tablet Take 20 mg by mouth at bedtime.        . Tamsulosin HCl (FLOMAX) 0.4 MG CAPS Take 0.4 mg by mouth daily.      . vitamin C (ASCORBIC ACID) 500 MG tablet Take 1,000 mg by mouth daily.        No current facility-administered medications for this visit.    Past Medical History  Diagnosis Date  .  CAD (coronary artery disease)     sees Dr. Dani Gobble Timmie Dugue   . Hypertension   . Hyperlipidemia   . Rheumatic fever   . Hyperglycemia   . BPH (benign prostatic hyperplasia)     sees Dr. Junious Silk   . Chronic kidney disease   . Kidney carcinoma     sees Dr. Junious Silk     Past Surgical History  Procedure Laterality Date  . Coronary artery bypass graft  1991    3 way CABG per Dr. Redmond Pulling   . Nephrectomy  1999    left kidney, per Dr. Hessie Diener   . Vasectomy    . Tonsillectomy and adenoidectomy    . Colonoscopy  2010    clear, repeat in 10 yrs   . US echocardiography  07/14/2009    mod-severe LVH,EF =>55%,LA mildly dilated,mild mitral & aortic annular ca+, AOV mod. sclerotic,discrete nodular thickening of the non-coronary cusp  . Cataract extraction, bilateral      Family History  Problem Relation Age of Onset  . Heart disease  Father   . Hyperlipidemia Father   . Hypertension Father   . Diabetes Father     History   Social History  . Marital Status: Married    Spouse Name: N/A    Number of Children: N/A  . Years of Education: N/A   Occupational History  . Not on file.   Social History Main Topics  . Smoking status: Never Smoker   . Smokeless tobacco: Never Used  . Alcohol Use: No  . Drug Use: No  . Sexual Activity: Not on file   Other Topics Concern  . Not on file   Social History Narrative  . No narrative on file    Review of systems: The patient specifically denies any chest pain at rest or with exertion, dyspnea at rest or with exertion, orthopnea, paroxysmal nocturnal dyspnea, syncope, palpitations, focal neurological deficits, intermittent claudication, lower extremity edema, unexplained weight gain, cough, hemoptysis or wheezing.  The patient also denies abdominal pain, nausea, vomiting, dysphagia, diarrhea, constipation, polyuria, polydipsia, dysuria, hematuria, frequency, urgency, abnormal bleeding or bruising, fever, chills, unexpected weight changes,  mood swings, change in skin or hair texture, change in voice quality, auditory or visual problems, allergic reactions or rashes, new musculoskeletal complaints other than usual "aches and pains".   PHYSICAL EXAM BP 152/80  Pulse 62  Ht 5\' 10"  (1.778 m)  Wt 233 lb 14.4 oz (106.096 kg)  BMI 33.56 kg/m2 General: Alert, oriented x3, no distress  Head: no evidence of trauma, PERRL, EOMI, no exophtalmos or lid lag, no myxedema, no xanthelasma; normal ears, nose and oropharynx  Neck: normal jugular venous pulsations and no hepatojugular reflux; brisk carotid pulses without delay and no carotid bruits  Chest: clear to auscultation, no signs of consolidation by percussion or palpation, normal fremitus, symmetrical and full respiratory excursions  Cardiovascular: normal position and quality of the apical impulse, regular rhythm, normal first and paradoxically split second heart sounds, grade 2-3/6 early peaking crescendo decrescendo systolic murmur in the aortic focus, rubs or gallops  Abdomen: no tenderness or distention, no masses by palpation, no abnormal pulsatility or arterial bruits, normal bowel sounds, no hepatosplenomegaly  Extremities: no clubbing, cyanosis or edema; 2+ radial, ulnar and brachial pulses bilaterally; 2+ right femoral, posterior tibial and dorsalis pedis pulses; 2+ left femoral, posterior tibial and dorsalis pedis pulses; no subclavian or femoral bruits  Neurological: grossly nonfocal   EKG: Sinus rhythm, chronic left bundle branch block  Lipid Panel     Component Value Date/Time   CHOL 134 06/18/2013 0813   TRIG 116 06/18/2013 0813   HDL 35* 06/18/2013 0813   CHOLHDL 3.8 06/18/2013 0813   VLDL 23 06/18/2013 0813   LDLCALC 76 06/18/2013 0813    BMET    Component Value Date/Time   NA 137 06/18/2013 0812   K 4.6 06/18/2013 0812   CL 101 06/18/2013 0812   CO2 23 06/18/2013 0812   GLUCOSE 128* 06/18/2013 0812   BUN 23 06/18/2013 0812   CREATININE 1.35 06/18/2013 0812   CREATININE 1.4  01/29/2013 0941   CALCIUM 9.3 06/18/2013 0812     ASSESSMENT AND PLAN Essential hypertension His blood pressure is slightly elevated today, but consistently normal at home. No adjustments were made to his medications  Hyperlipidemia Satisfactory LDL cholesterol level, but low HDL despite treatment with statin, niacin and omega-3 fatty acid supplements. The major way to solve this problem will be substantial weight loss. He has very prominent abdominal adiposity.  CAD (coronary artery disease) Despite  the fact the surgery was performed in 1991, he remains completely asymptomatic following bypass surgery. His previous angina pectoris was described as a peculiar sensation of "controlling cold air into his lungs". He has not had it since the 1991 bypass surgery. He has preserved LV ejection fraction.  LBBB (left bundle branch block) No clinical symptoms to suggest high-grade AV block. Avoid increasing the dose of beta blocker or adding other negative chronotropic drugs   Patient Instructions  Dr. Sallyanne Kuster recommends that you schedule a follow-up appointment in: ONE YEAR.  Your physician encouraged you to lose weight for better health.        Orders Placed This Encounter  Procedures  . EKG 12-Lead   No orders of the defined types were placed in this encounter.    Mirra Basilio  Sanda Klein, MD, [facility] [phone] office [phone] pager

## 2014-01-21 ENCOUNTER — Other Ambulatory Visit: Payer: Self-pay | Admitting: Family Medicine

## 2014-01-22 NOTE — Telephone Encounter (Signed)
Can we refill this? Does pt need appointment?

## 2014-03-20 ENCOUNTER — Encounter: Payer: Self-pay | Admitting: Family Medicine

## 2014-03-20 ENCOUNTER — Ambulatory Visit (INDEPENDENT_AMBULATORY_CARE_PROVIDER_SITE_OTHER): Payer: Medicare Other | Admitting: Family Medicine

## 2014-03-20 VITALS — BP 160/80 | HR 56 | Temp 97.7°F | Ht 70.0 in | Wt 242.0 lb

## 2014-03-20 DIAGNOSIS — R739 Hyperglycemia, unspecified: Secondary | ICD-10-CM | POA: Diagnosis not present

## 2014-03-20 DIAGNOSIS — Z0000 Encounter for general adult medical examination without abnormal findings: Secondary | ICD-10-CM | POA: Diagnosis not present

## 2014-03-20 DIAGNOSIS — E785 Hyperlipidemia, unspecified: Secondary | ICD-10-CM | POA: Diagnosis not present

## 2014-03-20 LAB — CBC WITH DIFFERENTIAL/PLATELET
BASOS ABS: 0.1 10*3/uL (ref 0.0–0.1)
BASOS PCT: 0.6 % (ref 0.0–3.0)
Eosinophils Absolute: 0.2 10*3/uL (ref 0.0–0.7)
Eosinophils Relative: 2.3 % (ref 0.0–5.0)
HCT: 42.8 % (ref 39.0–52.0)
HEMOGLOBIN: 14.5 g/dL (ref 13.0–17.0)
Lymphocytes Relative: 28.2 % (ref 12.0–46.0)
Lymphs Abs: 2.3 10*3/uL (ref 0.7–4.0)
MCHC: 33.9 g/dL (ref 30.0–36.0)
MCV: 90.9 fl (ref 78.0–100.0)
MONOS PCT: 7.7 % (ref 3.0–12.0)
Monocytes Absolute: 0.6 10*3/uL (ref 0.1–1.0)
NEUTROS ABS: 5 10*3/uL (ref 1.4–7.7)
Neutrophils Relative %: 61.2 % (ref 43.0–77.0)
Platelets: 180 10*3/uL (ref 150.0–400.0)
RBC: 4.7 Mil/uL (ref 4.22–5.81)
RDW: 14.9 % (ref 11.5–15.5)
WBC: 8.1 10*3/uL (ref 4.0–10.5)

## 2014-03-20 LAB — MICROALBUMIN / CREATININE URINE RATIO
Creatinine,U: 61.8 mg/dL
MICROALB UR: 9.9 mg/dL — AB (ref 0.0–1.9)
MICROALB/CREAT RATIO: 16 mg/g (ref 0.0–30.0)

## 2014-03-20 LAB — HEMOGLOBIN A1C: HEMOGLOBIN A1C: 6.8 % — AB (ref 4.6–6.5)

## 2014-03-20 LAB — TSH: TSH: 2.21 u[IU]/mL (ref 0.35–4.50)

## 2014-03-20 NOTE — Progress Notes (Signed)
   Subjective:    Patient ID: John Potter, male    DOB: 02-12-1934, 80 y.o.   MRN: [id number]  HPI 80 yr old male for a cpx. He is doing well except for chronic knee pain. He still works almost every day but he cannot walk for exercise like he used to. He still sees Dr. Sallyanne Kuster and Dr. Junious Silk once a year.    Review of Systems  Constitutional: Negative.   HENT: Negative.   Eyes: Negative.   Respiratory: Negative.   Cardiovascular: Negative.   Gastrointestinal: Negative.   Genitourinary: Negative.   Musculoskeletal: Negative.   Skin: Negative.   Neurological: Negative.   Psychiatric/Behavioral: Negative.        Objective:   Physical Exam  Constitutional: He is oriented to person, place, and time. No distress.  Obese   HENT:  Head: Normocephalic and atraumatic.  Right Ear: External ear normal.  Left Ear: External ear normal.  Nose: Nose normal.  Mouth/Throat: Oropharynx is clear and moist. No oropharyngeal exudate.  Eyes: Conjunctivae and EOM are normal. Pupils are equal, round, and reactive to light. Right eye exhibits no discharge. Left eye exhibits no discharge. No scleral icterus.  Neck: Neck supple. No JVD present. No tracheal deviation present. No thyromegaly present.  Cardiovascular: Normal rate, regular rhythm, normal heart sounds and intact distal pulses.  Exam reveals no gallop and no friction rub.   No murmur heard. Pulmonary/Chest: Effort normal and breath sounds normal. No respiratory distress. He has no wheezes. He has no rales. He exhibits no tenderness.  Abdominal: Soft. Bowel sounds are normal. He exhibits no distension and no mass. There is no tenderness. There is no rebound and no guarding.  Musculoskeletal: Normal range of motion. He exhibits no edema or tenderness.  Lymphadenopathy:    He has no cervical adenopathy.  Neurological: He is alert and oriented to person, place, and time. He has normal reflexes. No cranial nerve deficit. He exhibits normal  muscle tone. Coordination normal.  Skin: Skin is warm and dry. No rash noted. He is not diaphoretic. No erythema. No pallor.  Psychiatric: He has a normal mood and affect. His behavior is normal. Judgment and thought content normal.          Assessment & Plan:  Well exam. We will check a CBC, a TSH, and an A1c today. I reviewed all his other test results from his other doctors which he brought with him today. He needs to lose weight and I suggested he join a [employer] so he could swim for exercise.

## 2014-03-20 NOTE — Progress Notes (Signed)
Pre visit review using our clinic review tool, if applicable. No additional management support is needed unless otherwise documented below in the visit note. 

## 2014-04-11 ENCOUNTER — Telehealth: Payer: Self-pay | Admitting: Cardiovascular Disease

## 2014-04-11 DIAGNOSIS — Z79899 Other long term (current) drug therapy: Secondary | ICD-10-CM

## 2014-04-11 DIAGNOSIS — I2510 Atherosclerotic heart disease of native coronary artery without angina pectoris: Secondary | ICD-10-CM

## 2014-04-11 DIAGNOSIS — N183 Chronic kidney disease, stage 3 unspecified: Secondary | ICD-10-CM

## 2014-04-11 DIAGNOSIS — E785 Hyperlipidemia, unspecified: Secondary | ICD-10-CM

## 2014-04-11 NOTE — Telephone Encounter (Signed)
Mail lab slip to patient

## 2014-04-11 NOTE — Telephone Encounter (Signed)
Patient has appt in June with Dr. Loletha Grayer.  States he usually gets labwork prior.  Needs a lab slip.

## 2014-06-17 DIAGNOSIS — N183 Chronic kidney disease, stage 3 (moderate): Secondary | ICD-10-CM | POA: Diagnosis not present

## 2014-06-17 DIAGNOSIS — E785 Hyperlipidemia, unspecified: Secondary | ICD-10-CM | POA: Diagnosis not present

## 2014-06-17 DIAGNOSIS — I2510 Atherosclerotic heart disease of native coronary artery without angina pectoris: Secondary | ICD-10-CM | POA: Diagnosis not present

## 2014-06-17 DIAGNOSIS — Z79899 Other long term (current) drug therapy: Secondary | ICD-10-CM | POA: Diagnosis not present

## 2014-06-18 LAB — COMPREHENSIVE METABOLIC PANEL
ALBUMIN: 3.5 g/dL (ref 3.5–5.2)
ALT: 18 U/L (ref 0–53)
AST: 21 U/L (ref 0–37)
Alkaline Phosphatase: 46 U/L (ref 39–117)
BUN: 21 mg/dL (ref 6–23)
CO2: 25 mEq/L (ref 19–32)
Calcium: 9.1 mg/dL (ref 8.4–10.5)
Chloride: 104 mEq/L (ref 96–112)
Creat: 1.32 mg/dL (ref 0.50–1.35)
Glucose, Bld: 138 mg/dL — ABNORMAL HIGH (ref 70–99)
Potassium: 5.5 mEq/L — ABNORMAL HIGH (ref 3.5–5.3)
SODIUM: 137 meq/L (ref 135–145)
TOTAL PROTEIN: 6.1 g/dL (ref 6.0–8.3)
Total Bilirubin: 0.5 mg/dL (ref 0.2–1.2)

## 2014-06-18 LAB — LIPID PANEL
Cholesterol: 104 mg/dL (ref 0–200)
HDL: 36 mg/dL — ABNORMAL LOW (ref >=40)
LDL Cholesterol: 52 mg/dL (ref 0–99)
Total CHOL/HDL Ratio: 2.9 ratio
Triglycerides: 78 mg/dL (ref <150)
VLDL: 16 mg/dL (ref 0–40)

## 2014-06-19 ENCOUNTER — Other Ambulatory Visit: Payer: Self-pay | Admitting: *Deleted

## 2014-06-19 DIAGNOSIS — E878 Other disorders of electrolyte and fluid balance, not elsewhere classified: Secondary | ICD-10-CM

## 2014-06-24 DIAGNOSIS — R972 Elevated prostate specific antigen [PSA]: Secondary | ICD-10-CM | POA: Diagnosis not present

## 2014-06-26 ENCOUNTER — Telehealth: Payer: Self-pay | Admitting: Cardiovascular Disease

## 2014-06-26 NOTE — Telephone Encounter (Signed)
Patient notified he will need to have NON-fasting lab work 3-4 weeks after labs on 5/31 - week of June 20. He was concerned that he needed to have lab work done prior to visit with Dr. Loletha Grayer on Monday. Informed patient that if Dr. Loletha Grayer wanted him to go ahead and have lab work done on Monday he can send him down for lab work after visit.   Patient voiced understanding

## 2014-06-26 NOTE — Telephone Encounter (Signed)
Please call,have a question about getting his lab worlk.

## 2014-06-30 ENCOUNTER — Encounter: Payer: Self-pay | Admitting: Cardiovascular Disease

## 2014-06-30 ENCOUNTER — Ambulatory Visit (INDEPENDENT_AMBULATORY_CARE_PROVIDER_SITE_OTHER): Payer: Medicare Other | Admitting: Cardiovascular Disease

## 2014-06-30 VITALS — BP 144/68 | HR 64 | Ht 69.0 in | Wt 231.4 lb

## 2014-06-30 DIAGNOSIS — I2510 Atherosclerotic heart disease of native coronary artery without angina pectoris: Secondary | ICD-10-CM | POA: Diagnosis not present

## 2014-06-30 DIAGNOSIS — I10 Essential (primary) hypertension: Secondary | ICD-10-CM | POA: Diagnosis not present

## 2014-06-30 DIAGNOSIS — I447 Left bundle-branch block, unspecified: Secondary | ICD-10-CM

## 2014-06-30 DIAGNOSIS — N183 Chronic kidney disease, stage 3 unspecified: Secondary | ICD-10-CM

## 2014-06-30 DIAGNOSIS — R011 Cardiac murmur, unspecified: Secondary | ICD-10-CM

## 2014-06-30 DIAGNOSIS — E785 Hyperlipidemia, unspecified: Secondary | ICD-10-CM

## 2014-06-30 DIAGNOSIS — E669 Obesity, unspecified: Secondary | ICD-10-CM

## 2014-06-30 DIAGNOSIS — R0602 Shortness of breath: Secondary | ICD-10-CM

## 2014-06-30 MED ORDER — FUROSEMIDE 40 MG PO TABS: 40.0000 mg | ORAL_TABLET | Freq: Every day | ORAL | Status: DC

## 2014-06-30 NOTE — Patient Instructions (Signed)
Medication Instructions:   START Furosemide '40mg'$  daily. STOP Niacin.  Labwork:  Monday 07/07/2014 at [facility].  Testing/Procedures:  Your physician has requested that you have an echocardiogram. Echocardiography is a painless test that uses sound waves to create images of your heart. It provides your doctor with information about the size and shape of your heart and how well your heart's chambers and valves are working. This procedure takes approximately one hour. There are no restrictions for this procedure.    Follow-Up:  3 months  Any Other Special Instructions Will Be Listed Below (If Applicable).

## 2014-07-01 DIAGNOSIS — N138 Other obstructive and reflux uropathy: Secondary | ICD-10-CM | POA: Diagnosis not present

## 2014-07-01 DIAGNOSIS — N401 Enlarged prostate with lower urinary tract symptoms: Secondary | ICD-10-CM | POA: Diagnosis not present

## 2014-07-01 DIAGNOSIS — R351 Nocturia: Secondary | ICD-10-CM | POA: Diagnosis not present

## 2014-07-01 DIAGNOSIS — R972 Elevated prostate specific antigen [PSA]: Secondary | ICD-10-CM | POA: Diagnosis not present

## 2014-07-01 NOTE — Progress Notes (Signed)
Patient ID: John Potter, male   DOB: [birth year], 80 y.o.   MRN: [id number]     Cardiology Office Note   Date:  07/01/2014   ID:  John Potter, DOB 22-Jul-1934, MRN [id number]  PCP:  Laurey Morale, MD  Cardiologist:   Sanda Klein, MD   Chief Complaint  Patient presents with  . Annual Exam    some feet swelling, getting easily tired no concerns at moment      History of Present Illness: John Potter is a 80 y.o. male who presents for follow-up for coronary artery disease status post remote bypass surgery 1991, chronic left bundle branch block, systemic hypertension and dyslipidemia. Since his last appointment he has developed substantial lower extremity edema in a symmetrical pattern. This does not resolve after overnight recumbent position. He has also noticed that he gets easily tired. It is hard to say whether this is primarily due to leg weakness, dyspnea or back pain. All of them appear to be contributory. He does not have orthopnea. Weight has actually decreased by about 10 pounds since his last appointment in the spring. He has developed full-blown diabetes that is well controlled without medications with a hemoglobin A1c of 6.8%. On his pre-clinic labs his potassium was high. He does take an ACE inhibitor (prescription that has not changed for many years),but is not on any potassium supplements and has normal renal function. The cause for her hyperkalemia is unclear (consider spuriously elevated potassium due to hemolysis).  He denies angina pectoris, palpitations, paroxysmal nocturnal dyspnea, syncope, cough, hemoptysis. He has substantial problems with low back pain and leg pain.  He brings a detailed record of self monitored home blood pressure. This is generally well controlled, typically in the 130/60 mmHg range. Typical heart rate is in the 55-65 range. He has not been monitoring his weight.   Past Medical History  Diagnosis Date  . CAD (coronary artery disease)     sees  Dr. Dani Gobble Ailanie Ruttan   . Hypertension   . Hyperlipidemia   . Rheumatic fever   . Hyperglycemia   . BPH (benign prostatic hyperplasia)     sees Dr. Junious Silk   . Chronic kidney disease   . Kidney carcinoma     sees Dr. Junious Silk     Past Surgical History  Procedure Laterality Date  . Coronary artery bypass graft  1991    3 way CABG per Dr. Redmond Pulling   . Nephrectomy  1999    left kidney, per Dr. Hessie Diener   . Vasectomy    . Tonsillectomy and adenoidectomy    . Colonoscopy  2010    clear, repeat in 10 yrs   . US echocardiography  07/14/2009    mod-severe LVH,EF =>55%,LA mildly dilated,mild mitral & aortic annular ca+, AOV mod. sclerotic,discrete nodular thickening of the non-coronary cusp  . Cataract extraction, bilateral       Current Outpatient Prescriptions  Medication Sig Dispense Refill  . aspirin 81 MG tablet Take 81 mg by mouth daily.      . calcium carbonate (OS-CAL) 600 MG TABS Take 600 mg by mouth daily. Vit D 3     . ferrous sulfate 325 (65 FE) MG tablet TAKE 1 TABLET BY MOUTH ONCE DAILY WITH BREAKFAST 30 tablet 11  . fish oil-omega-3 fatty acids 1000 MG capsule Take 2 g by mouth daily.      Marland Kitchen lisinopril (PRINIVIL,ZESTRIL) 40 MG tablet Take 40 mg by mouth daily.      Marland Kitchen  metoprolol tartrate (LOPRESSOR) 25 MG tablet Take 25 mg by mouth 2 (two) times daily.      . Multiple Vitamin (MULTIVITAMIN) tablet Take 1 tablet by mouth daily.      . Multiple Vitamins-Minerals (PRESERVISION/LUTEIN) CAPS Take by mouth daily. Take 2 capsules every day    . simvastatin (ZOCOR) 20 MG tablet Take 20 mg by mouth at bedtime.      . Tamsulosin HCl (FLOMAX) 0.4 MG CAPS Take 0.4 mg by mouth daily.    . vitamin C (ASCORBIC ACID) 500 MG tablet Take 1,000 mg by mouth daily.     . furosemide (LASIX) 40 MG tablet Take 1 tablet (40 mg total) by mouth daily. 90 tablet 3   No current facility-administered medications for this visit.    Allergies:   Amoxicillin    Social History:  The patient   reports that he has never smoked. He has never used smokeless tobacco. He reports that he does not drink alcohol or use illicit drugs.   Family History:  The patient's family history includes Diabetes in his father; Heart disease in his father; Hyperlipidemia in his father; Hypertension in his father.    ROS:  Please see the history of present illness.    Otherwise, review of systems positive for none.   All other systems are reviewed and negative.    PHYSICAL EXAM: VS:  BP 144/68 mmHg  Pulse 64  Ht '5\' 9"'$  (1.753 m)  Wt 231 lb 6.4 oz (104.962 kg)  BMI 34.16 kg/m2 , BMI Body mass index is 34.16 kg/(m^2).  General: Alert, oriented x3, no distress Head: no evidence of trauma, PERRL, EOMI, no exophtalmos or lid lag, no myxedema, no xanthelasma; normal ears, nose and oropharynx Neck: 4-5 centimeters elevation injugular venous pulsations and no hepatojugular reflux; brisk carotid pulses without delay and no carotid bruits Chest: clear to auscultation, no signs of consolidation by percussion or palpation, normal fremitus, symmetrical and full respiratory excursions Cardiovascular: normal position and quality of the apical impulse, regular rhythm, normal first and paradoxically splitsecond heart sounds, no murmurs, rubs or gallops Abdomen: no tenderness or distention, no masses by palpation, no abnormal pulsatility or arterial bruits, normal bowel sounds, no hepatosplenomegaly Extremities: no clubbing, cyanosis, but has bilateral 2+ pitting edema, almost all the up to the knees; 2+ radial, ulnar and brachial pulses bilaterally; 2+ right femoral, posterior tibial and dorsalis pedis pulses; 2+ left femoral, posterior tibial and dorsalis pedis pulses; no subclavian or femoral bruits Neurological: grossly nonfocal Psych: euthymic mood, full affect   EKG:  EKG is ordered today. The ekg ordered today demonstrates this rhythm, chronic left bundle branch block, QRS 140, QTC 431   Recent  Labs: 03/20/2014: Hemoglobin 14.5; Platelets 180.0; TSH 2.21 06/17/2014: ALT 18; BUN 21; Creat 1.32; Potassium 5.5*; Sodium 137    Lipid Panel    Component Value Date/Time   CHOL 104 06/17/2014 0802   TRIG 78 06/17/2014 0802   HDL 36* 06/17/2014 0802   CHOLHDL 2.9 06/17/2014 0802   VLDL 16 06/17/2014 0802   LDLCALC 52 06/17/2014 0802      Wt Readings from Last 3 Encounters:  06/30/14 231 lb 6.4 oz (104.962 kg)  03/20/14 242 lb (109.77 kg)  06/25/13 233 lb 14.4 oz (106.096 kg)       ASSESSMENT AND PLAN:  Essential hypertension Consistently normal at home. No adjustments were made to his BP medications  New onset diastolic heart failure -presumptive diagnosis 25 years after bypass surgery, there  are many possible explanations including constrictive pericarditis, hypertension related diastolic dysfunction and may be even ignored reduction in left ventricular systolic function. Step will be to check an echocardiogram. If left ventricular systolic function is decreased or there are regional wall motion abnormality C should undergo coronary angiography. We'll also start treatment with diuretic.  Hyperlipidemia Satisfactory LDL cholesterol level, but low HDL despite treatment with statin, niacin and omega-3 fatty acid supplements. The major way to solve this problem will be substantial weight loss. He has very prominent abdominal adiposity. Based on most recent data, continued treatment with niacin is likely not to be beneficial. It may actually be worsening his glucose and I recommended that he stop taking niacin.  CAD (coronary artery disease) Despite the fact the surgery was performed in 1991, he remains completely asymptomatic following bypass surgery. His previous angina pectoris was described as a peculiar sensation of "drawing cold air into his lungs". He has not had it since the 1991 bypass surgery.   LBBB (left bundle branch block) No clinical symptoms to suggest high-grade AV  block. Avoid increasing the dose of beta blocker or adding other negative chronotropic drugs    Current medicines are reviewed at length with the patient today.  The patient does not have concerns regarding medicines.  The following changes have been made:  Furosemide once daily 40 mg, stop niacin, recheck labs including potassium level in one week  Labs/ tests ordered today include:  Orders Placed This Encounter  Procedures  . EKG 12-Lead  . ECHOCARDIOGRAM COMPLETE   Patient Instructions  Medication Instructions:   START Furosemide '40mg'$  daily. STOP Niacin.  Labwork:  Monday 07/07/2014 at [facility].  Testing/Procedures:  Your physician has requested that you have an echocardiogram. Echocardiography is a painless test that uses sound waves to create images of your heart. It provides your doctor with information about the size and shape of your heart and how well your heart's chambers and valves are working. This procedure takes approximately one hour. There are no restrictions for this procedure.    Follow-Up:  3 months  Any Other Special Instructions Will Be Listed Below (If Applicable).       Mikael Spray, MD  07/01/2014 1:54 PM    Sanda Klein, MD, [facility] [phone] office [phone] pager

## 2014-07-04 ENCOUNTER — Ambulatory Visit (HOSPITAL_COMMUNITY)
Admission: RE | Admit: 2014-07-04 | Discharge: 2014-07-04 | Disposition: A | Discharge status: Home / Self Care | Payer: Medicare Other | Source: Ambulatory Visit | Attending: Cardiovascular Disease | Admitting: Cardiovascular Disease

## 2014-07-04 DIAGNOSIS — I350 Nonrheumatic aortic (valve) stenosis: Secondary | ICD-10-CM | POA: Diagnosis not present

## 2014-07-04 DIAGNOSIS — R0602 Shortness of breath: Secondary | ICD-10-CM

## 2014-07-04 DIAGNOSIS — I2510 Atherosclerotic heart disease of native coronary artery without angina pectoris: Secondary | ICD-10-CM

## 2014-07-04 DIAGNOSIS — I517 Cardiomegaly: Secondary | ICD-10-CM | POA: Insufficient documentation

## 2014-07-04 DIAGNOSIS — R0600 Dyspnea, unspecified: Secondary | ICD-10-CM | POA: Diagnosis present

## 2014-07-04 MED ORDER — PERFLUTREN LIPID MICROSPHERE
1.0000 mL | INTRAVENOUS | Status: AC | PRN
  Administered 2014-07-04: 1 mL via INTRAVENOUS

## 2014-07-07 DIAGNOSIS — E878 Other disorders of electrolyte and fluid balance, not elsewhere classified: Secondary | ICD-10-CM | POA: Diagnosis not present

## 2014-07-08 LAB — BASIC METABOLIC PANEL
BUN: 36 mg/dL — ABNORMAL HIGH (ref 6–23)
CALCIUM: 9.2 mg/dL (ref 8.4–10.5)
CHLORIDE: 103 meq/L (ref 96–112)
CO2: 27 meq/L (ref 19–32)
CREATININE: 1.51 mg/dL — AB (ref 0.50–1.35)
GLUCOSE: 125 mg/dL — AB (ref 70–99)
Potassium: 4.9 mEq/L (ref 3.5–5.3)
Sodium: 141 mEq/L (ref 135–145)

## 2014-07-23 ENCOUNTER — Encounter: Payer: Self-pay | Admitting: Cardiovascular Disease

## 2014-07-23 ENCOUNTER — Telehealth: Payer: Self-pay | Admitting: Cardiovascular Disease

## 2014-07-23 ENCOUNTER — Ambulatory Visit (INDEPENDENT_AMBULATORY_CARE_PROVIDER_SITE_OTHER): Payer: Medicare Other | Admitting: Cardiovascular Disease

## 2014-07-23 VITALS — BP 140/60 | HR 70 | Ht 68.0 in | Wt 229.0 lb

## 2014-07-23 DIAGNOSIS — E785 Hyperlipidemia, unspecified: Secondary | ICD-10-CM

## 2014-07-23 DIAGNOSIS — I447 Left bundle-branch block, unspecified: Secondary | ICD-10-CM | POA: Diagnosis not present

## 2014-07-23 DIAGNOSIS — I2510 Atherosclerotic heart disease of native coronary artery without angina pectoris: Secondary | ICD-10-CM | POA: Diagnosis not present

## 2014-07-23 DIAGNOSIS — R0600 Dyspnea, unspecified: Secondary | ICD-10-CM

## 2014-07-23 DIAGNOSIS — I10 Essential (primary) hypertension: Secondary | ICD-10-CM

## 2014-07-23 DIAGNOSIS — E669 Obesity, unspecified: Secondary | ICD-10-CM

## 2014-07-23 DIAGNOSIS — R0602 Shortness of breath: Secondary | ICD-10-CM | POA: Diagnosis not present

## 2014-07-23 MED ORDER — FUROSEMIDE 40 MG PO TABS: 40.0000 mg | ORAL_TABLET | ORAL | Status: DC

## 2014-07-23 NOTE — Telephone Encounter (Signed)
Scheduling working on an appointment for this pt.

## 2014-07-23 NOTE — Telephone Encounter (Signed)
Pt called in stating that since he has started taking Furosemide, he says that his SOB has gotten worse. He did state that it gets worse with little exertion but none the less he would like to come in and be seen by Dr. Loletha Grayer to change this medication. Please call  Thanks

## 2014-07-23 NOTE — Patient Instructions (Signed)
Medication Instructions:   DECREASE FUROSEMIDE TO THREE TIMES WEEKLY  Labwork:  NONE  Testing/Procedures:  Your physician has requested that you have a lexiscan myoview. For further information please visit [email or web address]. Please follow instruction sheet, as given.    Follow-Up:  Dr. Sallyanne Kuster recommends that you schedule a follow-up appointment in: 3 MONTHS

## 2014-07-25 ENCOUNTER — Ambulatory Visit: Payer: Medicare Other | Admitting: Physician Assistant

## 2014-07-25 DIAGNOSIS — R0600 Dyspnea, unspecified: Secondary | ICD-10-CM | POA: Insufficient documentation

## 2014-07-25 NOTE — Progress Notes (Signed)
Patient ID: John Potter, male   DOB: 1934-10-12, 80 y.o.   MRN: [id number]     Cardiology Office Note   Date:  07/25/2014   ID:  John Potter, DOB 1934-03-22, MRN [id number]  PCP:  Laurey Morale, MD  Cardiologist:  Sanda Klein, MD   Chief Complaint  Patient presents with  . Shortness of Breath    Patient has felt SOB and has had some swelling.      History of Present Illness: John Potter is a 80 y.o. male who presents for exertional dyspnea.   Treatment with diuretics helped the edema , but did not make any significant positive impact on his dyspnea and he feels weaker.  He has not had any angina pectoris.His echocardiogram showed benign findings.  He has mild aortic valve stenosis with an estimated valve area of 1.4 cm and a mean gradient of only 14 mmHg. Left ventricular ejection fraction is normal at 55-60 percent. Diastolic parameters did not suggest elevated left atrial filling pressures at rest (E/e'<10).  He has only mild residual ankle edema and he weighs 13 pounds less now than he did in March  He had bypass surgery in 1991. He has a chronic left bundle branch block, systemic hypertension and dyslipidemia.  Both his blood sugar and cholesterol levels are well controlled with pharmacological therapy.He has diabetes mellitus type 2 with recent onset although he has had borderline hyperglycemia for very long time.    Past Medical History  Diagnosis Date  . CAD (coronary artery disease)     sees Dr. Dani Gobble Kasandra Fehr   . Hypertension   . Hyperlipidemia   . Rheumatic fever   . Hyperglycemia   . BPH (benign prostatic hyperplasia)     sees Dr. Junious Silk   . Chronic kidney disease   . Kidney carcinoma     sees Dr. Junious Silk     Past Surgical History  Procedure Laterality Date  . Coronary artery bypass graft  1991    3 way CABG per Dr. Redmond Pulling   . Nephrectomy  1999    left kidney, per Dr. Hessie Diener   . Vasectomy    . Tonsillectomy and adenoidectomy    . Colonoscopy   2010    clear, repeat in 10 yrs   . US echocardiography  07/14/2009    mod-severe LVH,EF =>55%,LA mildly dilated,mild mitral & aortic annular ca+, AOV mod. sclerotic,discrete nodular thickening of the non-coronary cusp  . Cataract extraction, bilateral       Current Outpatient Prescriptions  Medication Sig Dispense Refill  . aspirin 81 MG tablet Take 81 mg by mouth daily.      . calcium carbonate (OS-CAL) 600 MG TABS Take 600 mg by mouth daily. Vit D 3     . ferrous sulfate 325 (65 FE) MG tablet TAKE 1 TABLET BY MOUTH ONCE DAILY WITH BREAKFAST 30 tablet 11  . fish oil-omega-3 fatty acids 1000 MG capsule Take 2 g by mouth daily.      . furosemide (LASIX) 40 MG tablet Take 1 tablet (40 mg total) by mouth 3 (three) times a week. 90 tablet 3  . lisinopril (PRINIVIL,ZESTRIL) 40 MG tablet Take 40 mg by mouth daily.      . metoprolol tartrate (LOPRESSOR) 25 MG tablet Take 25 mg by mouth 2 (two) times daily.      . Multiple Vitamin (MULTIVITAMIN) tablet Take 1 tablet by mouth daily.      . Multiple Vitamins-Minerals (PRESERVISION/LUTEIN) CAPS Take by  mouth daily. Take 2 capsules every day    . simvastatin (ZOCOR) 20 MG tablet Take 20 mg by mouth at bedtime.      . Tamsulosin HCl (FLOMAX) 0.4 MG CAPS Take 0.4 mg by mouth daily.    . vitamin C (ASCORBIC ACID) 500 MG tablet Take 1,000 mg by mouth daily.      No current facility-administered medications for this visit.    Allergies:   Amoxicillin    Social History:  The patient  reports that he has never smoked. He has never used smokeless tobacco. He reports that he does not drink alcohol or use illicit drugs.   Family History:  The patient's family history includes Diabetes in his father; Heart disease in his father; Hyperlipidemia in his father; Hypertension in his father.    ROS:  Please see the history of present illness.    Otherwise, review of systems positive for none.   All other systems are reviewed and negative.    PHYSICAL  EXAM: VS:  BP 140/60 mmHg  Pulse 70  Ht '5\' 8"'$  (1.727 m)  Wt 229 lb (103.874 kg)  BMI 34.83 kg/m2 , BMI Body mass index is 34.83 kg/(m^2).  General: Alert, oriented x3, no distress Head: no evidence of trauma, PERRL, EOMI, no exophtalmos or lid lag, no myxedema, no xanthelasma; normal ears, nose and oropharynx Neck: normal jugular venous pulsations and no hepatojugular reflux; brisk carotid pulses without delay and no carotid bruits Chest: clear to auscultation, no signs of consolidation by percussion or palpation, normal fremitus, symmetrical and full respiratory excursions Cardiovascular: normal position and quality of the apical impulse, regular rhythm, normal first and paradoxically split second heart sounds, 3/6 aortic ejection murmur, rubs or gallops Abdomen: no tenderness or distention, no masses by palpation, no abnormal pulsatility or arterial bruits, normal bowel sounds, no hepatosplenomegaly Extremities: no clubbing, cyanosis; mild ankle edema; 2+ radial, ulnar and brachial pulses bilaterally; 2+ right femoral, posterior tibial and dorsalis pedis pulses; 2+ left femoral, posterior tibial and dorsalis pedis pulses; no subclavian or femoral bruits Neurological: grossly nonfocal Psych: euthymic mood, full affect   Recent Labs: 03/20/2014: Hemoglobin 14.5; Platelets 180.0; TSH 2.21 06/17/2014: ALT 18 07/07/2014: BUN 36*; Creat 1.51*; Potassium 4.9; Sodium 141    Lipid Panel    Component Value Date/Time   CHOL 104 06/17/2014 0802   TRIG 78 06/17/2014 0802   HDL 36* 06/17/2014 0802   CHOLHDL 2.9 06/17/2014 0802   VLDL 16 06/17/2014 0802   LDLCALC 52 06/17/2014 0802      Wt Readings from Last 3 Encounters:  07/23/14 229 lb (103.874 kg)  06/30/14 231 lb 6.4 oz (104.962 kg)  03/20/14 242 lb (109.77 kg)     ASSESSMENT AND PLAN:  John Potter continues to have substantial exertional dyspnea despite normal left ventricular systolic function  And at most equivocal findings for  diastolic dysfunction and elevated filling pressures. The obvious concern is that his dyspnea is an angina equivalent especially since 25 years have passed since he had bypass surgery. He does not have angina pectoris and his left bundle branch block limits the usefulness of ECG evaluation.   I have recommended that he undergo right and left heart catheterization and coronary/graft angiography to clarify the situation but he prefers more conservative approach and we have settled on a nuclear perfusion study first. If this is abnormal he should clearly undergo heart catheterization.  Even if the nuclear study is normal , if we cannot identify a different  cause for his dyspnea , I would still recommend right and left heart catheterization.   differential diagnosis still includes constrictive pericarditis.   Current medicines are reviewed at length with the patient today.  The patient does not have concerns regarding medicines.  The following changes have been made:  no change  Labs/ tests ordered today include:  Orders Placed This Encounter  Procedures  . Myocardial Perfusion Imaging    Patient Instructions  Medication Instructions:   DECREASE FUROSEMIDE TO THREE TIMES WEEKLY  Labwork:  NONE  Testing/Procedures:  Your physician has requested that you have a lexiscan myoview. For further information please visit [email or web address]. Please follow instruction sheet, as given.    Follow-Up:  Dr. Sallyanne Kuster recommends that you schedule a follow-up appointment in: [place], Emina Ribaudo, MD  07/25/2014 10:04 PM    Sanda Klein, MD, [facility] [phone] office [phone] pager

## 2014-07-30 ENCOUNTER — Telehealth: Payer: Self-pay | Admitting: Family Medicine

## 2014-07-30 ENCOUNTER — Inpatient Hospital Stay (HOSPITAL_COMMUNITY)
Admission: EM | Admit: 2014-07-30 | Discharge: 2014-08-01 | DRG: 292 | Disposition: A | Discharge status: Home / Self Care | Payer: Medicare Other | Attending: Internal Medicine | Admitting: Internal Medicine

## 2014-07-30 ENCOUNTER — Telehealth (HOSPITAL_COMMUNITY): Payer: Self-pay

## 2014-07-30 ENCOUNTER — Encounter (HOSPITAL_COMMUNITY): Payer: Self-pay | Admitting: Family Medicine

## 2014-07-30 ENCOUNTER — Emergency Department (HOSPITAL_COMMUNITY): Payer: Medicare Other

## 2014-07-30 DIAGNOSIS — I10 Essential (primary) hypertension: Secondary | ICD-10-CM | POA: Diagnosis present

## 2014-07-30 DIAGNOSIS — I350 Nonrheumatic aortic (valve) stenosis: Secondary | ICD-10-CM | POA: Diagnosis not present

## 2014-07-30 DIAGNOSIS — N183 Chronic kidney disease, stage 3 (moderate): Secondary | ICD-10-CM | POA: Diagnosis present

## 2014-07-30 DIAGNOSIS — E875 Hyperkalemia: Secondary | ICD-10-CM | POA: Diagnosis not present

## 2014-07-30 DIAGNOSIS — Z9842 Cataract extraction status, left eye: Secondary | ICD-10-CM | POA: Diagnosis not present

## 2014-07-30 DIAGNOSIS — Z881 Allergy status to other antibiotic agents status: Secondary | ICD-10-CM

## 2014-07-30 DIAGNOSIS — C801 Malignant (primary) neoplasm, unspecified: Secondary | ICD-10-CM | POA: Diagnosis not present

## 2014-07-30 DIAGNOSIS — R0609 Other forms of dyspnea: Secondary | ICD-10-CM | POA: Diagnosis not present

## 2014-07-30 DIAGNOSIS — J948 Other specified pleural conditions: Secondary | ICD-10-CM | POA: Diagnosis not present

## 2014-07-30 DIAGNOSIS — Z7982 Long term (current) use of aspirin: Secondary | ICD-10-CM | POA: Diagnosis not present

## 2014-07-30 DIAGNOSIS — J90 Pleural effusion, not elsewhere classified: Secondary | ICD-10-CM | POA: Diagnosis not present

## 2014-07-30 DIAGNOSIS — I447 Left bundle-branch block, unspecified: Secondary | ICD-10-CM | POA: Diagnosis present

## 2014-07-30 DIAGNOSIS — R0602 Shortness of breath: Secondary | ICD-10-CM

## 2014-07-30 DIAGNOSIS — M109 Gout, unspecified: Secondary | ICD-10-CM | POA: Diagnosis not present

## 2014-07-30 DIAGNOSIS — Z961 Presence of intraocular lens: Secondary | ICD-10-CM | POA: Diagnosis present

## 2014-07-30 DIAGNOSIS — Z9889 Other specified postprocedural states: Secondary | ICD-10-CM | POA: Diagnosis not present

## 2014-07-30 DIAGNOSIS — Z9841 Cataract extraction status, right eye: Secondary | ICD-10-CM | POA: Diagnosis not present

## 2014-07-30 DIAGNOSIS — E785 Hyperlipidemia, unspecified: Secondary | ICD-10-CM | POA: Diagnosis present

## 2014-07-30 DIAGNOSIS — C772 Secondary and unspecified malignant neoplasm of intra-abdominal lymph nodes: Secondary | ICD-10-CM | POA: Diagnosis not present

## 2014-07-30 DIAGNOSIS — N400 Enlarged prostate without lower urinary tract symptoms: Secondary | ICD-10-CM | POA: Diagnosis present

## 2014-07-30 DIAGNOSIS — R5383 Other fatigue: Secondary | ICD-10-CM

## 2014-07-30 DIAGNOSIS — R091 Pleurisy: Secondary | ICD-10-CM | POA: Diagnosis not present

## 2014-07-30 DIAGNOSIS — I2510 Atherosclerotic heart disease of native coronary artery without angina pectoris: Secondary | ICD-10-CM

## 2014-07-30 DIAGNOSIS — Z87891 Personal history of nicotine dependence: Secondary | ICD-10-CM

## 2014-07-30 DIAGNOSIS — E1122 Type 2 diabetes mellitus with diabetic chronic kidney disease: Secondary | ICD-10-CM | POA: Diagnosis not present

## 2014-07-30 DIAGNOSIS — Z951 Presence of aortocoronary bypass graft: Secondary | ICD-10-CM

## 2014-07-30 DIAGNOSIS — C771 Secondary and unspecified malignant neoplasm of intrathoracic lymph nodes: Secondary | ICD-10-CM | POA: Diagnosis not present

## 2014-07-30 DIAGNOSIS — I129 Hypertensive chronic kidney disease with stage 1 through stage 4 chronic kidney disease, or unspecified chronic kidney disease: Secondary | ICD-10-CM | POA: Diagnosis not present

## 2014-07-30 DIAGNOSIS — I5033 Acute on chronic diastolic (congestive) heart failure: Secondary | ICD-10-CM | POA: Insufficient documentation

## 2014-07-30 DIAGNOSIS — Z85528 Personal history of other malignant neoplasm of kidney: Secondary | ICD-10-CM | POA: Diagnosis not present

## 2014-07-30 DIAGNOSIS — Z905 Acquired absence of kidney: Secondary | ICD-10-CM | POA: Diagnosis not present

## 2014-07-30 DIAGNOSIS — R918 Other nonspecific abnormal finding of lung field: Secondary | ICD-10-CM | POA: Diagnosis not present

## 2014-07-30 HISTORY — DX: Cardiac murmur, unspecified: R01.1

## 2014-07-30 HISTORY — DX: Type 2 diabetes mellitus without complications: E11.9

## 2014-07-30 HISTORY — DX: Personal history of other diseases of the musculoskeletal system and connective tissue: Z87.39

## 2014-07-30 LAB — BASIC METABOLIC PANEL
ANION GAP: 6 (ref 5–15)
BUN: 39 mg/dL — ABNORMAL HIGH (ref 6–20)
CALCIUM: 10.6 mg/dL — AB (ref 8.9–10.3)
CO2: 27 mmol/L (ref 22–32)
CREATININE: 1.55 mg/dL — AB (ref 0.61–1.24)
Chloride: 108 mmol/L (ref 101–111)
GFR calc Af Amer: 48 mL/min — ABNORMAL LOW (ref >60)
GFR calc non Af Amer: 41 mL/min — ABNORMAL LOW (ref >60)
GLUCOSE: 188 mg/dL — AB (ref 65–99)
Potassium: 5 mmol/L (ref 3.5–5.1)
Sodium: 141 mmol/L (ref 135–145)

## 2014-07-30 LAB — I-STAT TROPONIN, ED: Troponin i, poc: 0.01 ng/mL (ref 0.00–0.08)

## 2014-07-30 LAB — CBC
HCT: 32.4 % — ABNORMAL LOW (ref 39.0–52.0)
HEMOGLOBIN: 10.2 g/dL — AB (ref 13.0–17.0)
MCH: 27.3 pg (ref 26.0–34.0)
MCHC: 31.5 g/dL (ref 30.0–36.0)
MCV: 86.9 fL (ref 78.0–100.0)
PLATELETS: 251 10*3/uL (ref 150–400)
RBC: 3.73 MIL/uL — AB (ref 4.22–5.81)
RDW: 15.2 % (ref 11.5–15.5)
WBC: 10.5 10*3/uL (ref 4.0–10.5)

## 2014-07-30 LAB — BRAIN NATRIURETIC PEPTIDE: B Natriuretic Peptide: 124.1 pg/mL — ABNORMAL HIGH (ref 0.0–100.0)

## 2014-07-30 MED ORDER — FERROUS SULFATE 325 (65 FE) MG PO TABS
325.0000 mg | ORAL_TABLET | Freq: Every day | ORAL | Status: DC
  Administered 2014-08-01: 325 mg via ORAL
  Filled 2014-07-30 (×3): qty 1

## 2014-07-30 MED ORDER — CALCIUM CARBONATE 600 MG PO TABS
600.0000 mg | ORAL_TABLET | Freq: Every day | ORAL | Status: DC
  Filled 2014-07-30: qty 1

## 2014-07-30 MED ORDER — FUROSEMIDE 10 MG/ML IJ SOLN
40.0000 mg | Freq: Once | INTRAMUSCULAR | Status: AC
  Administered 2014-07-30: 40 mg via INTRAVENOUS
  Filled 2014-07-30: qty 4

## 2014-07-30 MED ORDER — ASPIRIN EC 81 MG PO TBEC
81.0000 mg | DELAYED_RELEASE_TABLET | Freq: Every evening | ORAL | Status: DC
Start: 2014-07-30 — End: 2014-08-01
  Administered 2014-07-30 – 2014-07-31 (×2): 81 mg via ORAL
  Filled 2014-07-30 (×3): qty 1

## 2014-07-30 MED ORDER — ONDANSETRON HCL 4 MG/2ML IJ SOLN: 4.0000 mg | Freq: Four times a day (QID) | INTRAMUSCULAR | Status: DC | PRN

## 2014-07-30 MED ORDER — MAGNESIUM HYDROXIDE 400 MG/5ML PO SUSP: 30.0000 mL | Freq: Every day | ORAL | Status: DC | PRN

## 2014-07-30 MED ORDER — ALUM & MAG HYDROXIDE-SIMETH 200-200-20 MG/5ML PO SUSP: 30.0000 mL | Freq: Four times a day (QID) | ORAL | Status: DC | PRN

## 2014-07-30 MED ORDER — ACETAMINOPHEN 325 MG PO TABS
650.0000 mg | ORAL_TABLET | Freq: Four times a day (QID) | ORAL | Status: DC | PRN
  Administered 2014-07-31: 650 mg via ORAL
  Filled 2014-07-30: qty 2

## 2014-07-30 MED ORDER — SIMVASTATIN 20 MG PO TABS
20.0000 mg | ORAL_TABLET | Freq: Every day | ORAL | Status: DC
  Administered 2014-07-30 – 2014-07-31 (×2): 20 mg via ORAL
  Filled 2014-07-30 (×4): qty 1

## 2014-07-30 MED ORDER — ONDANSETRON HCL 4 MG PO TABS: 4.0000 mg | ORAL_TABLET | Freq: Four times a day (QID) | ORAL | Status: DC | PRN

## 2014-07-30 MED ORDER — LISINOPRIL 40 MG PO TABS
40.0000 mg | ORAL_TABLET | Freq: Every day | ORAL | Status: DC
  Filled 2014-07-30: qty 1

## 2014-07-30 MED ORDER — FUROSEMIDE 40 MG PO TABS
40.0000 mg | ORAL_TABLET | Freq: Every day | ORAL | Status: DC
Start: 2014-07-31 — End: 2014-08-01
  Administered 2014-07-31 – 2014-08-01 (×2): 40 mg via ORAL
  Filled 2014-07-30 (×3): qty 1

## 2014-07-30 MED ORDER — TAMSULOSIN HCL 0.4 MG PO CAPS
0.4000 mg | ORAL_CAPSULE | Freq: Every day | ORAL | Status: DC
  Administered 2014-07-31 – 2014-08-01 (×2): 0.4 mg via ORAL
  Filled 2014-07-30 (×3): qty 1

## 2014-07-30 MED ORDER — ACETAMINOPHEN 650 MG RE SUPP: 650.0000 mg | Freq: Four times a day (QID) | RECTAL | Status: DC | PRN

## 2014-07-30 MED ORDER — HEPARIN SODIUM (PORCINE) 5000 UNIT/ML IJ SOLN: 5000.0000 [IU] | Freq: Three times a day (TID) | INTRAMUSCULAR | Status: DC

## 2014-07-30 MED ORDER — CALCIUM CARBONATE 1250 (500 CA) MG PO TABS
1.0000 | ORAL_TABLET | Freq: Every day | ORAL | Status: DC
  Administered 2014-08-01: 500 mg via ORAL
  Filled 2014-07-30 (×3): qty 1

## 2014-07-30 MED ORDER — METOPROLOL TARTRATE 25 MG PO TABS
25.0000 mg | ORAL_TABLET | Freq: Two times a day (BID) | ORAL | Status: DC
  Administered 2014-07-30 – 2014-08-01 (×4): 25 mg via ORAL
  Filled 2014-07-30 (×6): qty 1

## 2014-07-30 NOTE — ED Provider Notes (Signed)
CSN: [id number]     Arrival date & time 07/30/14  1138 History   First MD Initiated Contact with Patient 07/30/14 1209     Chief Complaint  Patient presents with  . Shortness of Breath  . Weakness     (Consider location/radiation/quality/duration/timing/severity/associated sxs/prior Treatment) Patient is a 80 y.o. male presenting with shortness of breath. The history is provided by the patient.  Shortness of Breath Severity:  Mild Onset quality:  Gradual Timing:  Constant Progression:  Worsening Chronicity:  Chronic Context: activity   Relieved by:  Rest Worsened by:  Exertion Ineffective treatments:  Diuretics Associated symptoms: no abdominal pain, no chest pain, no cough, no fever, no headaches, no neck pain and no vomiting     Past Medical History  Diagnosis Date  . CAD (coronary artery disease)     sees Dr. Dani Gobble Croitoru   . Hypertension   . Hyperlipidemia   . Rheumatic fever   . Hyperglycemia   . BPH (benign prostatic hyperplasia)     sees Dr. Junious Silk   . Chronic kidney disease   . Kidney carcinoma     sees Dr. Junious Silk    Past Surgical History  Procedure Laterality Date  . Coronary artery bypass graft  1991    3 way CABG per Dr. Redmond Pulling   . Nephrectomy  1999    left kidney, per Dr. Hessie Diener   . Vasectomy    . Tonsillectomy and adenoidectomy    . Colonoscopy  2010    clear, repeat in 10 yrs   . US echocardiography  07/14/2009    mod-severe LVH,EF =>55%,LA mildly dilated,mild mitral & aortic annular ca+, AOV mod. sclerotic,discrete nodular thickening of the non-coronary cusp  . Cataract extraction, bilateral     Family History  Problem Relation Age of Onset  . Heart disease Father   . Hyperlipidemia Father   . Hypertension Father   . Diabetes Father    History  Substance Use Topics  . Smoking status: Never Smoker   . Smokeless tobacco: Never Used  . Alcohol Use: No    Review of Systems  Constitutional: Positive for fatigue. Negative for  fever.  HENT: Negative for drooling and rhinorrhea.   Eyes: Negative for pain.  Respiratory: Positive for shortness of breath. Negative for cough.   Cardiovascular: Negative for chest pain and leg swelling.  Gastrointestinal: Negative for nausea, vomiting, abdominal pain and diarrhea.  Genitourinary: Negative for dysuria and hematuria.  Musculoskeletal: Negative for gait problem and neck pain.  Skin: Negative for color change.  Neurological: Negative for numbness and headaches.  Hematological: Negative for adenopathy.  Psychiatric/Behavioral: Negative for behavioral problems.  All other systems reviewed and are negative.     Allergies  Amoxicillin  Home Medications   Prior to Admission medications   Medication Sig Start Date End Date Taking? Authorizing Provider  aspirin 81 MG tablet Take 81 mg by mouth daily.      Historical Provider, MD  calcium carbonate (OS-CAL) 600 MG TABS Take 600 mg by mouth daily. Vit D 3     Historical Provider, MD  ferrous sulfate 325 (65 FE) MG tablet TAKE 1 TABLET BY MOUTH ONCE DAILY WITH BREAKFAST 01/23/14   Laurey Morale, MD  fish oil-omega-3 fatty acids 1000 MG capsule Take 2 g by mouth daily.      Historical Provider, MD  furosemide (LASIX) 40 MG tablet Take 1 tablet (40 mg total) by mouth 3 (three) times a week. 07/23/14  Mihai Croitoru, MD  lisinopril (PRINIVIL,ZESTRIL) 40 MG tablet Take 40 mg by mouth daily.      Historical Provider, MD  metoprolol tartrate (LOPRESSOR) 25 MG tablet Take 25 mg by mouth 2 (two) times daily.      Historical Provider, MD  Multiple Vitamin (MULTIVITAMIN) tablet Take 1 tablet by mouth daily.      Historical Provider, MD  Multiple Vitamins-Minerals (PRESERVISION/LUTEIN) CAPS Take by mouth daily. Take 2 capsules every day    Historical Provider, MD  simvastatin (ZOCOR) 20 MG tablet Take 20 mg by mouth at bedtime.      Historical Provider, MD  Tamsulosin HCl (FLOMAX) 0.4 MG CAPS Take 0.4 mg by mouth daily.    Historical  Provider, MD  vitamin C (ASCORBIC ACID) 500 MG tablet Take 1,000 mg by mouth daily.     Historical Provider, MD   BP 141/58 mmHg  Pulse 65  Temp(Src) 98 F (36.7 C) (Oral)  Resp 18  Ht '5\' 8"'$  (1.727 m)  Wt 229 lb (103.874 kg)  BMI 34.83 kg/m2  SpO2 95% Physical Exam  Constitutional: He is oriented to person, place, and time. He appears well-developed and well-nourished.  HENT:  Head: Normocephalic and atraumatic.  Right Ear: External ear normal.  Left Ear: External ear normal.  Nose: Nose normal.  Mouth/Throat: Oropharynx is clear and moist. No oropharyngeal exudate.  Eyes: Conjunctivae and EOM are normal. Pupils are equal, round, and reactive to light.  Neck: Normal range of motion. Neck supple.  Cardiovascular: Normal rate, regular rhythm and intact distal pulses.  Exam reveals no gallop and no friction rub.   Murmur heard. Pulmonary/Chest: Effort normal and breath sounds normal. No respiratory distress. He has no wheezes.  Abdominal: Soft. Bowel sounds are normal. He exhibits no distension. There is no tenderness. There is no rebound and no guarding.  Musculoskeletal: Normal range of motion. He exhibits edema (mild to moderate pitting edema and distal bilateral lower extremities.). He exhibits no tenderness.  Neurological: He is alert and oriented to person, place, and time.  Skin: Skin is warm and dry.  Psychiatric: He has a normal mood and affect. His behavior is normal.  Nursing note and vitals reviewed.   ED Course  Procedures (including critical care time) Labs Review Labs Reviewed  CBC - Abnormal; Notable for the following:    RBC 3.73 (*)    Hemoglobin 10.2 (*)    HCT 32.4 (*)    All other components within normal limits  BASIC METABOLIC PANEL  BRAIN NATRIURETIC PEPTIDE  I-STAT TROPOININ, ED    Imaging Review Dg Chest 2 View  07/30/2014   CLINICAL DATA:  Shortness of breath.  EXAM: CHEST  2 VIEW  COMPARISON:  Report from 07/04/2002  FINDINGS: Large left  pleural effusion occupying 75% of left hemithoracic volume. Multiple old left rib deformities.  Prior CABG. Atherosclerotic aortic arch. Suspected enlargement of the cardiopericardial silhouette (left heart border obscured).  The right lung appears clear.  Thoracic spondylosis noted.  IMPRESSION: 1. Large left pleural effusion with passive atelectasis. The effusion occupies about 75% of left hemithoracic volume. 2. Suspected enlargement of the cardiopericardial silhouette but no overt edema. Prior CABG. 3. Atherosclerotic aortic arch.   Electronically Signed   By: Van Clines M.D.   On: 07/30/2014 12:47     EKG Interpretation None      ED ECG REPORT   Date: 07/30/2014  Rate: 66  Rhythm: normal sinus rhythm  QRS Axis: normal  Intervals: normal  ST/T Wave abnormalities: normal  Conduction Disutrbances:left bundle branch block  Narrative Interpretation: No significant change since last tracing, LBBB  Old EKG Reviewed: unchanged  I have personally reviewed the EKG tracing and agree with the computerized printout as noted.  MDM   Final diagnoses:  SOB (shortness of breath)  Other fatigue  Pleural effusion    12:57 PM 80 y.o. male w hx of CAD s/p CABG ('91), CKD, HTN who presents with worsening shortness of breath and fatigue. This has been ongoing for several months but has acutely worsened in the last 2 weeks. The patient has been following with cardiology and has been on Lasix every other day. The patient was set up to get a nuclear perfusion study 2 days from now. He is afebrile and vital signs are unremarkable here. We'll get screening labs and imaging.  Cards consulted who recommends medicine admit.    Pamella Pert, MD 07/30/14 2103

## 2014-07-30 NOTE — Telephone Encounter (Signed)
Encounter complete. 

## 2014-07-30 NOTE — H&P (Signed)
[facility] History and Physical  John Potter GQQ:[id number] DOB: 1934/08/27 DOA: 07/30/2014  Referring physician: Aline Brochure PCP: Laurey Morale, MD   Chief Complaint: shortness of breath  HPI: John Potter is a 80 y.o. male  With h/o CAD, CABG, diet controlled DM, CKD, HTN, presents with worsening DOE and bilateral leg edema over the past several months.  Recent echo with normal EF.  Started on lasix by cardiology recently without improvement in symptoms. No CP, orthopnea, palpitations, fever.  CXR in ED shows new left pleural effusion. BNP not high. Scheduled for outpt myoview this Friday. EDP consulted cardiology who have evaluated patient and recommend admission to medicine for workup of pleural effusion and possible further cardiac testing. TSH in March normal.  Review of Systems:  Complete systems reviewed. As above otherwise negative  Past Medical History  Diagnosis Date  . CAD (coronary artery disease)     sees Dr. Dani Gobble Croitoru   . Hypertension   . Hyperlipidemia   . Rheumatic fever   . Hyperglycemia   . BPH (benign prostatic hyperplasia)     sees Dr. Junious Silk   . Chronic kidney disease   . Kidney carcinoma     sees Dr. Junious Silk    Past Surgical History  Procedure Laterality Date  . Coronary artery bypass graft  1991    3 way CABG per Dr. Redmond Pulling   . Nephrectomy  1999    left kidney, per Dr. Hessie Diener   . Vasectomy    . Tonsillectomy and adenoidectomy    . Colonoscopy  2010    clear, repeat in 10 yrs   . US echocardiography  07/14/2009    mod-severe LVH,EF =>55%,LA mildly dilated,mild mitral & aortic annular ca+, AOV mod. sclerotic,discrete nodular thickening of the non-coronary cusp  . Cataract extraction, bilateral     Social History:  reports that he has never smoked. He has never used smokeless tobacco. He reports that he does not drink alcohol or use illicit drugs.  Allergies  Allergen Reactions  . Amoxicillin     rash    Family History    Problem Relation Age of Onset  . Heart disease Father   . Hyperlipidemia Father   . Hypertension Father   . Diabetes Father     Prior to Admission medications   Medication Sig Start Date End Date Taking? Authorizing Provider  aspirin 81 MG tablet Take 81 mg by mouth every evening.    Yes Historical Provider, MD  calcium carbonate (OS-CAL) 600 MG TABS Take 600 mg by mouth daily. Vit D 3    Yes Historical Provider, MD  ferrous sulfate 325 (65 FE) MG tablet TAKE 1 TABLET BY MOUTH ONCE DAILY WITH BREAKFAST 01/23/14  Yes Laurey Morale, MD  fish oil-omega-3 fatty acids 1000 MG capsule Take 2 g by mouth daily.     Yes Historical Provider, MD  furosemide (LASIX) 40 MG tablet Take 1 tablet (40 mg total) by mouth 3 (three) times a week. 07/23/14  Yes Mihai Croitoru, MD  lisinopril (PRINIVIL,ZESTRIL) 40 MG tablet Take 40 mg by mouth daily.     Yes Historical Provider, MD  metoprolol tartrate (LOPRESSOR) 25 MG tablet Take 25 mg by mouth 2 (two) times daily.     Yes Historical Provider, MD  Multiple Vitamin (MULTIVITAMIN) tablet Take 1 tablet by mouth daily.     Yes Historical Provider, MD  Multiple Vitamins-Minerals (PRESERVISION/LUTEIN) CAPS Take by mouth daily. Take 2 capsules every day  Yes Historical Provider, MD  naproxen sodium (ANAPROX) 220 MG tablet Take 220 mg by mouth 2 (two) times daily as needed (pain).   Yes Historical Provider, MD  simvastatin (ZOCOR) 20 MG tablet Take 20 mg by mouth at bedtime.     Yes Historical Provider, MD  Tamsulosin HCl (FLOMAX) 0.4 MG CAPS Take 0.4 mg by mouth daily.   Yes Historical Provider, MD  vitamin C (ASCORBIC ACID) 500 MG tablet Take 1,000 mg by mouth daily.    Yes Historical Provider, MD   Physical Exam: Filed Vitals:   07/30/14 1600 07/30/14 1604 07/30/14 1643 07/30/14 1724  BP: 150/66 198/68 160/68 144/70  Pulse: 65 66 67 73  Temp:    98.1 F (36.7 C)  TempSrc:    Oral  Resp: '21 17 16 18  '$ Height:    '5\' 8"'$  (1.727 m)  Weight:    96.8 kg (213 lb 6.5  oz)  SpO2: 97% 95% 96% 97%    Wt Readings from Last 3 Encounters:  07/30/14 96.8 kg (213 lb 6.5 oz)  07/23/14 103.874 kg (229 lb)  06/30/14 104.962 kg (231 lb 6.4 oz)    BP 135/62 mmHg  Pulse 80  Temp(Src) 98.3 F (36.8 C) (Oral)  Resp 18  Ht '5\' 8"'$  (1.727 m)  Wt 96.8 kg (213 lb 6.5 oz)  BMI 32.46 kg/m2  SpO2 94%  General Appearance:    Alert, cooperative, no distress, appears stated age, comfortable  Head:    Normocephalic, without obvious abnormality, atraumatic  Eyes:    PERRL, conjunctiva/corneas clear, EOM's intact,    Nose:   Nares normal, septum midline, mucosa normal, no drainage   or sinus tenderness  Throat:   Lips, mucosa, and tongue normal; teeth and gums normal  Neck:   Supple, symmetrical, trachea midline, no adenopathy;       thyroid:  No enlargement/tenderness/nodules; no carotid   bruit or JVD  Back:     Symmetric, no curvature, ROM normal, no CVA tenderness  Lungs:     Diminished on left with dullness to percussion. No WRR  Chest wall:    No tenderness or deformity  Heart:    Regular rate and rhythm, S1 and S2 normal, no murmur, rub   or gallop  Abdomen:     Soft, non-tender, bowel sounds active all four quadrants,    no masses, no organomegaly  Genitalia:    deferred  Rectal:    deferred  Extremities:   Bilateral 1-2+ edema  Pulses:   2+ and symmetric all extremities  Skin:   Skin color, texture, turgor normal, no rashes or lesions  Lymph nodes:   Cervical, supraclavicular, and axillary nodes normal  Neurologic:   CNII-XII intact. Normal strength, sensation and reflexes      throughout            Psych: normal affect  Labs on Admission:  Basic Metabolic Panel:  Recent Labs Lab 07/30/14 1204  NA 141  K 5.0  CL 108  CO2 27  GLUCOSE 188*  BUN 39*  CREATININE 1.55*  CALCIUM 10.6*   Liver Function Tests: No results for input(s): AST, ALT, ALKPHOS, BILITOT, PROT, ALBUMIN in the last 168 hours. No results for input(s): LIPASE, AMYLASE in  the last 168 hours. No results for input(s): AMMONIA in the last 168 hours. CBC:  Recent Labs Lab 07/30/14 1204  WBC 10.5  HGB 10.2*  HCT 32.4*  MCV 86.9  PLT 251   Cardiac Enzymes: No  results for input(s): CKTOTAL, CKMB, CKMBINDEX, TROPONINI in the last 168 hours.  BNP (last 3 results)  Recent Labs  07/30/14 1204  BNP 124.1*    ProBNP (last 3 results) No results for input(s): PROBNP in the last 8760 hours.  CBG: No results for input(s): GLUCAP in the last 168 hours.  Radiological Exams on Admission: Dg Chest 2 View  07/30/2014   CLINICAL DATA:  Shortness of breath.  EXAM: CHEST  2 VIEW  COMPARISON:  Report from 07/04/2002  FINDINGS: Large left pleural effusion occupying 75% of left hemithoracic volume. Multiple old left rib deformities.  Prior CABG. Atherosclerotic aortic arch. Suspected enlargement of the cardiopericardial silhouette (left heart border obscured).  The right lung appears clear.  Thoracic spondylosis noted.  IMPRESSION: 1. Large left pleural effusion with passive atelectasis. The effusion occupies about 75% of left hemithoracic volume. 2. Suspected enlargement of the cardiopericardial silhouette but no overt edema. Prior CABG. 3. Atherosclerotic aortic arch.   Electronically Signed   By: Van Clines M.D.   On: 07/30/2014 12:47    EKG: not on chart. Reportedly NSR with LBBB  Assessment/Plan Principal Problem:   DOE (dyspnea on exertion): pleural effusion contributing. Also, cardiology feels pt is fluid overloaded and ordered IV lasix x1. Ordered IR to do US guided thoracentesis.  Further workup per cardiology after thoracentesis Active Problems:   CAD (coronary artery disease):    Hyperlipidemia   Essential hypertension   LBBB (left bundle branch block)   Pleural effusion on left: see above.  Code Status: full Family Communication: multiple at bedside Disposition Plan: home in a few days  Time spent: 60 min  [facility]

## 2014-07-30 NOTE — ED Notes (Signed)
Ambulated pt in the hall pt states that he felt SOB while walking but 02 saturation remained between 94-97% no other complaints noted at this time

## 2014-07-30 NOTE — Consult Note (Signed)
Patient ID: John Potter MRN: [id number], DOB/AGE: 78/10/1934   Admit date: 07/30/2014   Primary Physician: Laurey Morale, MD Primary Cardiologist: Dr. Sallyanne Kuster  Pt. Profile:  80 y/o male with h/o CAD s/p CABG in 1991, HTN, HLD, DM, mild AS and normal LVF on recent echo, presenting with worsening DOE.   Problem List  Past Medical History  Diagnosis Date  . CAD (coronary artery disease)     sees Dr. Dani Gobble Croitoru   . Hypertension   . Hyperlipidemia   . Rheumatic fever   . Hyperglycemia   . BPH (benign prostatic hyperplasia)     sees Dr. Junious Silk   . Chronic kidney disease   . Kidney carcinoma     sees Dr. Junious Silk     Past Surgical History  Procedure Laterality Date  . Coronary artery bypass graft  1991    3 way CABG per Dr. Redmond Pulling   . Nephrectomy  1999    left kidney, per Dr. Hessie Diener   . Vasectomy    . Tonsillectomy and adenoidectomy    . Colonoscopy  2010    clear, repeat in 10 yrs   . US echocardiography  07/14/2009    mod-severe LVH,EF =>55%,LA mildly dilated,mild mitral & aortic annular ca+, AOV mod. sclerotic,discrete nodular thickening of the non-coronary cusp  . Cataract extraction, bilateral       Allergies  Allergies  Allergen Reactions  . Amoxicillin     rash    HPI  80 y/o male followed by Dr. Sallyanne Kuster who presents to the ED with complaints of worsening DOE.  He has known CAD. He had bypass surgery in 1991. He has a chronic left bundle branch block, systemic hypertension, DM and dyslipidemia.He has recently been evaluated by Dr. Sallyanne Kuster in clinic for similar complaints of DOE. Symptoms have progressed over the last several months. No resting dyspnea. He denies chest pain. His most recent office visit with Dr. Chari Manning was 1 week ago on 07/23/14. 2D echo was ordered which showed benign findings. He has mild aortic valve stenosis with an estimated valve area of 1.4 cm and a mean gradient of only 14 mmHg. Left ventricular ejection fraction is  normal at 55-60 percent. Diastolic parameters did not suggest elevated left atrial filling pressures at rest (E/e'<10). Dr. Sallyanne Kuster felt that his symptoms are possibly his anginal equivalent and initially recommended a right and left heart cath for definitive assessment, however the patient favored a more conservative approach and settled on a nuclear perfusion study first, which is scheduled later this week on 08/01/14.   In the ED today, his EKG shows SR with chronic LBBB. His CXR is notable for a large left-sided pleural effusion.    Home Medications  Prior to Admission medications   Medication Sig Start Date End Date Taking? Authorizing Provider  aspirin 81 MG tablet Take 81 mg by mouth every evening.    Yes Historical Provider, MD  calcium carbonate (OS-CAL) 600 MG TABS Take 600 mg by mouth daily. Vit D 3    Yes Historical Provider, MD  ferrous sulfate 325 (65 FE) MG tablet TAKE 1 TABLET BY MOUTH ONCE DAILY WITH BREAKFAST 01/23/14  Yes Laurey Morale, MD  fish oil-omega-3 fatty acids 1000 MG capsule Take 2 g by mouth daily.     Yes Historical Provider, MD  furosemide (LASIX) 40 MG tablet Take 1 tablet (40 mg total) by mouth 3 (three) times a week. 07/23/14  Yes Sanda Klein, MD  lisinopril (PRINIVIL,ZESTRIL) 40 MG tablet Take 40 mg by mouth daily.     Yes Historical Provider, MD  metoprolol tartrate (LOPRESSOR) 25 MG tablet Take 25 mg by mouth 2 (two) times daily.     Yes Historical Provider, MD  Multiple Vitamin (MULTIVITAMIN) tablet Take 1 tablet by mouth daily.     Yes Historical Provider, MD  Multiple Vitamins-Minerals (PRESERVISION/LUTEIN) CAPS Take by mouth daily. Take 2 capsules every day   Yes Historical Provider, MD  naproxen sodium (ANAPROX) 220 MG tablet Take 220 mg by mouth 2 (two) times daily as needed (pain).   Yes Historical Provider, MD  simvastatin (ZOCOR) 20 MG tablet Take 20 mg by mouth at bedtime.     Yes Historical Provider, MD  Tamsulosin HCl (FLOMAX) 0.4 MG CAPS Take 0.4  mg by mouth daily.   Yes Historical Provider, MD  vitamin C (ASCORBIC ACID) 500 MG tablet Take 1,000 mg by mouth daily.    Yes Historical Provider, MD    Family History  Family History  Problem Relation Age of Onset  . Heart disease Father   . Hyperlipidemia Father   . Hypertension Father   . Diabetes Father     Social History  History   Social History  . Marital Status: Married    Spouse Name: N/A  . Number of Children: N/A  . Years of Education: N/A   Occupational History  . Not on file.   Social History Main Topics  . Smoking status: Never Smoker   . Smokeless tobacco: Never Used  . Alcohol Use: No  . Drug Use: No  . Sexual Activity: Not on file   Other Topics Concern  . Not on file   Social History Narrative     Review of Systems General:  No chills, fever, night sweats or weight changes.  Cardiovascular:  No chest pain, + dyspnea on exertion, edema,  No orthopnea, palpitations, paroxysmal nocturnal dyspnea. Dermatological: No rash, lesions/masses Respiratory: No cough, dyspnea Urologic: No hematuria, dysuria Abdominal:   No nausea, vomiting, diarrhea, bright red blood per rectum, melena, or hematemesis Neurologic:  No visual changes, wkns, changes in mental status. All other systems reviewed and are otherwise negative except as noted above.  Physical Exam  Blood pressure 198/68, pulse 67, temperature 98 F (36.7 C), temperature source Oral, resp. rate 16, height '5\' 8"'$  (1.727 m), weight 229 lb (103.874 kg), SpO2 96 %.  General: Pleasant, NAD Psych: Normal affect. Neuro: Alert and oriented X 3. Moves all extremities spontaneously. HEENT: Normal  Neck: Supple without bruits mild JVD. Lungs:  Resp regular and unlabored, decreased BS over the left lung base Heart: RRR no s3, s4, or 1-2/6 SM. Abdomen: Soft, non-tender, non-distended, BS + x 4.  Extremities: No clubbing or cyanosis. 1+ bilateral LEE. DP/PT/Radials 2+ and equal  bilaterally.  Labs  Troponin (Point of Care Test)  Recent Labs  07/30/14 1214  TROPIPOC 0.01   No results for input(s): CKTOTAL, CKMB, TROPONINI in the last 72 hours. Lab Results  Component Value Date   WBC 10.5 07/30/2014   HGB 10.2* 07/30/2014   HCT 32.4* 07/30/2014   MCV 86.9 07/30/2014   PLT 251 07/30/2014     Recent Labs Lab 07/30/14 1204  NA 141  K 5.0  CL 108  CO2 27  BUN 39*  CREATININE 1.55*  CALCIUM 10.6*  GLUCOSE 188*   Lab Results  Component Value Date   CHOL 104 06/17/2014   HDL 36* 06/17/2014  LDLCALC 52 06/17/2014   TRIG 78 06/17/2014   No results found for: DDIMER   Radiology/Studies  Dg Chest 2 View  07/30/2014   CLINICAL DATA:  Shortness of breath.  EXAM: CHEST  2 VIEW  COMPARISON:  Report from 07/04/2002  FINDINGS: Large left pleural effusion occupying 75% of left hemithoracic volume. Multiple old left rib deformities.  Prior CABG. Atherosclerotic aortic arch. Suspected enlargement of the cardiopericardial silhouette (left heart border obscured).  The right lung appears clear.  Thoracic spondylosis noted.  IMPRESSION: 1. Large left pleural effusion with passive atelectasis. The effusion occupies about 75% of left hemithoracic volume. 2. Suspected enlargement of the cardiopericardial silhouette but no overt edema. Prior CABG. 3. Atherosclerotic aortic arch.   Electronically Signed   By: Van Clines M.D.   On: 07/30/2014 12:47    ECG  SR with chronic LBBB    ASSESSMENT AND PLAN  Principal Problem:   DOE (dyspnea on exertion) Active Problems:   CAD (coronary artery disease)   Hyperlipidemia   Essential hypertension   LBBB (left bundle branch block)   1. DOE/Pleural Effusion: large left-sided pleural effusion noted on CXR which is likely the cause. Recommend IM admit. Will need thoracentesis.  2. CAD: h/o CABG in 1991. No recent CP. DOE likely 2/2 problem #1. Will reassess symptoms after treatment of pleural effusion. If still  symptomatic, will consider NST to r/o ischemia.   3. HTN: elevated. Continue home meds (lisinopril and metoprolol). Monitor and adjust if needed.   4. HLD: on statin therapy.   SignedLyda Jester, PA-C 07/30/2014, 3:34 PM   I have personally seen and examined this patient with Lyda Jester, PA-C.  I agree with the assessment and plan as outlined above. He is presenting with worsened dyspnea for one motnh. Exam findings include no breath sounds left lung base and mid lung fields. Systolic murmur. 1+ bilateral LE edema. He is found to have a large left pleural effusion which likely explains his dyspnea. Would have the IM team admit and continue workup of effusion and plan thoracentesis. He had CABG in 1991. Recent echo with normal LV systolic function with mild to moderate AS which should not be contributing to his dyspnea. Cannot exclude progression of CAD but would not pursue further cardiac workup until we see how he responds symptomatically to treatment of his pleural effusion. He does seem mildly volume overloaded. Would start IV Lasix.   Quianna Avery 07/30/2014 4:18 PM

## 2014-07-30 NOTE — Telephone Encounter (Signed)
Pt went to ED

## 2014-07-30 NOTE — ED Notes (Signed)
Pt here for increased weakness and SOB with exertion. sts he has been taking lasix and getting worse. sts saw cardiology last week. Pt having BLE swelling.

## 2014-07-30 NOTE — Telephone Encounter (Signed)
[name] DOB: 08-23-1934 Initial Comment Caller states father has had SOB, tired, weak for past couple of weeks. Saw cardiologist 3-4 wks ago, EKG normal, put on lasics for swelling. Has gotten worse with SOB since lasics. Nurse Assessment Nurse: Markus Daft, RN, Sherre Poot Date/Time Eilene Ghazi Time): 07/30/2014 10:28:23 AM Confirm and document reason for call. If symptomatic, describe symptoms. ---Caller states father has had SOB, fatigue, swelling in legs, weak for past couple of weeks. Seen by cardiologist 3-4 wks ago, EKG normal, put on lasix for swelling. Has gotten worse with SOB, and not helping swelling in legs since lasix. Scheduled for nuclear stress test. --- RN called the pt. - He agrees with info. given. He is sitting now, and not SOB. But with walking he is SOB. Has the patient traveled out of the country within the last 30 days? ---Not Applicable Does the patient require triage? ---Yes Related visit to physician within the last 2 weeks? ---Yes Does the PT have any chronic conditions? (i.e. diabetes, asthma, etc.) ---Yes List chronic conditions. ---CABG 25 years ago, CAD Guidelines Guideline Title Affirmed Question Affirmed Notes Leg Swelling and Edema [1] Difficulty breathing with exertion (e.g., walking) AND [2] new onset or worsening Final Disposition User Go to ED Now (or PCP triage) Markus Daft, RN, Windy Comments RN reviewed schedule and no available appts within the hour. They will go to [facility] ER. Referrals GO TO FACILITY UNDECIDED REFERRED TO PCP OFFICE Disagree/Comply: Comply

## 2014-07-31 ENCOUNTER — Inpatient Hospital Stay (HOSPITAL_COMMUNITY): Payer: Medicare Other

## 2014-07-31 ENCOUNTER — Encounter (HOSPITAL_COMMUNITY): Payer: Self-pay | Admitting: Pulmonary Disease

## 2014-07-31 DIAGNOSIS — I5033 Acute on chronic diastolic (congestive) heart failure: Principal | ICD-10-CM

## 2014-07-31 LAB — LACTATE DEHYDROGENASE, PLEURAL OR PERITONEAL FLUID: LD FL: 459 U/L — AB (ref 3–23)

## 2014-07-31 LAB — PROTEIN, BODY FLUID: Total protein, fluid: 3.9 g/dL

## 2014-07-31 LAB — GLUCOSE, CAPILLARY
GLUCOSE-CAPILLARY: 128 mg/dL — AB (ref 65–99)
Glucose-Capillary: 143 mg/dL — ABNORMAL HIGH (ref 65–99)
Glucose-Capillary: 151 mg/dL — ABNORMAL HIGH (ref 65–99)

## 2014-07-31 LAB — BODY FLUID CELL COUNT WITH DIFFERENTIAL
EOS FL: 1 %
Lymphs, Fluid: 90 %
Monocyte-Macrophage-Serous Fluid: 2 % — ABNORMAL LOW (ref 50–90)
Neutrophil Count, Fluid: 7 % (ref 0–25)
Total Nucleated Cell Count, Fluid: 544 cu mm (ref 0–1000)

## 2014-07-31 LAB — HEPATIC FUNCTION PANEL
ALT: 28 U/L (ref 17–63)
AST: 30 U/L (ref 15–41)
Albumin: 2.6 g/dL — ABNORMAL LOW (ref 3.5–5.0)
Alkaline Phosphatase: 49 U/L (ref 38–126)
BILIRUBIN DIRECT: 0.2 mg/dL (ref 0.1–0.5)
BILIRUBIN INDIRECT: 0.1 mg/dL — AB (ref 0.3–0.9)
TOTAL PROTEIN: 7 g/dL (ref 6.5–8.1)
Total Bilirubin: 0.3 mg/dL (ref 0.3–1.2)

## 2014-07-31 LAB — PROTIME-INR
INR: 1.37 (ref 0.00–1.49)
Prothrombin Time: 17 seconds — ABNORMAL HIGH (ref 11.6–15.2)

## 2014-07-31 LAB — BASIC METABOLIC PANEL
ANION GAP: 8 (ref 5–15)
BUN: 38 mg/dL — ABNORMAL HIGH (ref 6–20)
CALCIUM: 10.3 mg/dL (ref 8.9–10.3)
CO2: 25 mmol/L (ref 22–32)
Chloride: 105 mmol/L (ref 101–111)
Creatinine, Ser: 1.37 mg/dL — ABNORMAL HIGH (ref 0.61–1.24)
GFR, EST AFRICAN AMERICAN: 55 mL/min — AB (ref >60)
GFR, EST NON AFRICAN AMERICAN: 48 mL/min — AB (ref >60)
Glucose, Bld: 129 mg/dL — ABNORMAL HIGH (ref 65–99)
Potassium: 5.2 mmol/L — ABNORMAL HIGH (ref 3.5–5.1)
SODIUM: 138 mmol/L (ref 135–145)

## 2014-07-31 LAB — GLUCOSE, SEROUS FLUID: GLUCOSE FL: 132 mg/dL

## 2014-07-31 LAB — LACTATE DEHYDROGENASE
LDH: 201 U/L — ABNORMAL HIGH (ref 98–192)
LDH: 206 U/L — ABNORMAL HIGH (ref 98–192)

## 2014-07-31 LAB — PROTEIN, TOTAL: Total Protein: 6.5 g/dL (ref 6.5–8.1)

## 2014-07-31 LAB — APTT: APTT: 33 s (ref 24–37)

## 2014-07-31 MED ORDER — HEPARIN SODIUM (PORCINE) 5000 UNIT/ML IJ SOLN
5000.0000 [IU] | Freq: Three times a day (TID) | INTRAMUSCULAR | Status: DC
  Administered 2014-07-31 – 2014-08-01 (×2): 5000 [IU] via SUBCUTANEOUS
  Filled 2014-07-31 (×5): qty 1

## 2014-07-31 MED ORDER — LIDOCAINE HCL (PF) 1 % IJ SOLN
INTRAMUSCULAR | Status: AC
  Filled 2014-07-31: qty 10

## 2014-07-31 MED ORDER — SODIUM POLYSTYRENE SULFONATE 15 GM/60ML PO SUSP
30.0000 g | Freq: Once | ORAL | Status: AC
  Administered 2014-07-31: 30 g via ORAL
  Filled 2014-07-31: qty 120

## 2014-07-31 NOTE — Procedures (Signed)
Thoracentesis Procedure Note  Pre-operative Diagnosis: Left Pleural Effusion   Post-operative Diagnosis: same  Indications: Diagnostic evaluation of pleural fluid and therapeutic relief of dyspnea   Procedure Details  Consent: Informed consent was obtained. Risks of the procedure were discussed including: infection, bleeding, pain, pneumothorax.  Under sterile conditions the patient was positioned. Betadine solution and sterile drapes were utilized.  1% plain lidocaine was used to anesthetize the 7th rib space. Fluid was obtained without any difficulties and minimal blood loss.  A dressing was applied to the wound and wound care instructions were provided.   Findings 1250 ml of rust colored pleural fluid was obtained. A sample was sent to pathology cell counts, as well as for infection analysis.  Complications:  None; patient tolerated the procedure well.          Condition: stable  Plan A follow up chest x-ray was ordered.  Procedure performed under direct supervision of Dr. Titus Mould and with ultrasound guidance for real time fluid / pleural evaluation.      Noe Gens, NP-C [facility] Pgr: [phone] or if no answer [phone] 07/31/2014, 12:11 PM

## 2014-07-31 NOTE — Consult Note (Signed)
  [place] real time chest left  1. Large effusion, small lung flap, no loculations, no mass noted, some compressive atx, no fibrinous material  Lavon Paganini. Titus Mould, MD, [place] Pgr: [facility]

## 2014-07-31 NOTE — Progress Notes (Addendum)
Patient Demographics:    John Potter, is a 80 y.o. male, DOB - 08-Jan-1935, FMB:[id number]  Admit date - 07/30/2014   Admitting Physician Delfina Redwood, MD  Outpatient Primary MD for the patient is Laurey Morale, MD  LOS - 1   Chief Complaint  Patient presents with  . Shortness of Breath  . Weakness        Subjective:    John Potter today has, No headache, No chest pain, No abdominal pain - No Nausea, No new weakness tingling or numbness, No Cough - Mild exertional SOB.     Assessment  & Plan :     1. Exertional shortness of breath due to large left-sided pleural effusion - most likely transudative effusion due to acute on chronic diastolic CHF, recent echo done a few months ago shows EF of 55-60%. On diuretics. I have requested to do ultrasound-guided thoracentesis. Appropriate lab work ordered. Pulmonary requested to evaluate one time as well.    2. Acute on chronic diastolic CHF. EF 55%. Being followed by cardiology, on beta blocker, Lasix continued.    3. BPH. On Flomax continue.    4. Dyslipidemia. On home dose statin.    5.HTN - B blocker home days.    6.Hyperkalemia - Hold ACE, Kayexalate.    Code Status : Full  Family Communication  : None present  Disposition Plan  : Home 1-2 days  Consults  : IR, pulmonary  Procedures  :   Ultrasound-guided thoracentesis ordered  TTE  06-2014  - Left ventricle: The cavity size was normal. Wall thickness was increased in a pattern of moderate LVH. Systolic function wasnormal. The estimated ejection fraction was in the range of 55%to 60%. - Aortic valve: There was mild stenosis. Valve area (VTI): 1.43cm^2. Valve area (Vmax): 1.35 cm^2. Valve area (Vmean): 1.38cm^2. - Left atrium: The atrium was mildly dilated. -  Atrial septum: There was increased thickness of the septum,consistent with lipomatous hypertrophy. No defect or patentforamen ovale was identified.   DVT Prophylaxis  :    Heparin - SCDs   Lab Results  Component Value Date   PLT 251 07/30/2014    Inpatient Medications  Scheduled Meds: . aspirin EC  81 mg Oral QPM  . calcium carbonate  1 tablet Oral Q breakfast  . ferrous sulfate  325 mg Oral Q breakfast  . furosemide  40 mg Oral Daily  . metoprolol tartrate  25 mg Oral BID  . simvastatin  20 mg Oral QHS  . sodium polystyrene  30 g Oral Once  . tamsulosin  0.4 mg Oral Daily   Continuous Infusions:  PRN Meds:.acetaminophen **OR** acetaminophen, alum & mag hydroxide-simeth, magnesium hydroxide, ondansetron **OR** ondansetron (ZOFRAN) IV  Antibiotics  :     Anti-infectives    None        Objective:   Filed Vitals:   07/30/14 1724 07/30/14 2104 07/30/14 2236 07/31/14 0422  BP: 144/70 135/62 141/70 146/68  Pulse: 73 80 84 69  Temp: 98.1 F (36.7 C) 98.3 F (36.8 C)  97.9 F (36.6 C)  TempSrc: Oral Oral  Oral  Resp: '18 18  18  '$ Height: '5\' 8"'$  (1.727 m)     Weight: 96.8 kg (213 lb 6.5 oz)  99.61 kg (219 lb 9.6 oz)  SpO2: 97% 94%  98%    Wt Readings from Last 3 Encounters:  07/31/14 99.61 kg (219 lb 9.6 oz)  07/23/14 103.874 kg (229 lb)  06/30/14 104.962 kg (231 lb 6.4 oz)     Intake/Output Summary (Last 24 hours) at 07/31/14 0847 Last data filed at 07/30/14 2231  Gross per 24 hour  Intake      0 ml  Output    425 ml  Net   -425 ml     Physical Exam  Awake Alert, Oriented X 3, No new F.N deficits, Normal affect [place].AT,PERRAL Supple Neck,No JVD, No cervical lymphadenopathy appriciated.  Symmetrical Chest wall movement, good air movement on the right side, reduced breath sounds in the left base RRR,No Gallops,Rubs or new Murmurs, No Parasternal Heave +ve B.Sounds, Abd Soft, No tenderness, No organomegaly appriciated, No rebound - guarding or rigidity. No  Cyanosis, Clubbing or edema, No new Rash or bruise     Data Review:   Micro Results No results found for this or any previous visit (from the past 240 hour(s)).  Radiology Reports Dg Chest 2 View  07/30/2014   CLINICAL DATA:  Shortness of breath.  EXAM: CHEST  2 VIEW  COMPARISON:  Report from 07/04/2002  FINDINGS: Large left pleural effusion occupying 75% of left hemithoracic volume. Multiple old left rib deformities.  Prior CABG. Atherosclerotic aortic arch. Suspected enlargement of the cardiopericardial silhouette (left heart border obscured).  The right lung appears clear.  Thoracic spondylosis noted.  IMPRESSION: 1. Large left pleural effusion with passive atelectasis. The effusion occupies about 75% of left hemithoracic volume. 2. Suspected enlargement of the cardiopericardial silhouette but no overt edema. Prior CABG. 3. Atherosclerotic aortic arch.   Electronically Signed   By: Van Clines M.D.   On: 07/30/2014 12:47     CBC  Recent Labs Lab 07/30/14 1204  WBC 10.5  HGB 10.2*  HCT 32.4*  PLT 251  MCV 86.9  MCH 27.3  MCHC 31.5  RDW 15.2    Chemistries   Recent Labs Lab 07/30/14 1204 07/31/14 0355  NA 141 138  K 5.0 5.2*  CL 108 105  CO2 27 25  GLUCOSE 188* 129*  BUN 39* 38*  CREATININE 1.55* 1.37*  CALCIUM 10.6* 10.3   ------------------------------------------------------------------------------------------------------------------ estimated creatinine clearance is 50.8 mL/min (by C-G formula based on Cr of 1.37). ------------------------------------------------------------------------------------------------------------------ No results for input(s): HGBA1C in the last 72 hours. ------------------------------------------------------------------------------------------------------------------ No results for input(s): CHOL, HDL, LDLCALC, TRIG, CHOLHDL, LDLDIRECT in the last 72  hours. ------------------------------------------------------------------------------------------------------------------ No results for input(s): TSH, T4TOTAL, T3FREE, THYROIDAB in the last 72 hours.  Invalid input(s): FREET3 ------------------------------------------------------------------------------------------------------------------ No results for input(s): VITAMINB12, FOLATE, FERRITIN, TIBC, IRON, RETICCTPCT in the last 72 hours.  Coagulation profile No results for input(s): INR, PROTIME in the last 168 hours.  No results for input(s): DDIMER in the last 72 hours.  Cardiac Enzymes No results for input(s): CKMB, TROPONINI, MYOGLOBIN in the last 168 hours.  Invalid input(s): CK ------------------------------------------------------------------------------------------------------------------ Invalid input(s): POCBNP   Time Spent in minutes 35   Lala Lund K M.D on 07/31/2014 at 8:47 AM  Between 7am to 7pm - Pager - [phone]  After 7pm go to [email or web address] - password [facility]  [facility]   Office  [phone]

## 2014-07-31 NOTE — Progress Notes (Signed)
Utilization review completed.  

## 2014-07-31 NOTE — Consult Note (Signed)
Name: John Potter MRN: [id number] DOB: February 21, 1934    ADMISSION DATE:  07/30/2014 CONSULTATION DATE:  07/31/14  REFERRING MD :  Dr. Candiss Norse   CHIEF COMPLAINT:  SOB, Abnormal CXR   BRIEF PATIENT DESCRIPTION: 80 y/o M admitted 7/13 with a 6 month history of mild SOB, progressive over past 1 month PTA, lower extremity swelling and fatigue.  Work up notable for left pleural effusion and PCCM consulted for evaluation.   SIGNIFICANT EVENTS  7/13  Admit with SOB, CXR with L pleural effusion  STUDIES:  6/17  ECHO >> LVEF 55-60%, mild AS, mild LA dilation, no PFO   HISTORY OF PRESENT ILLNESS:  80 y/o M, former smoker (16 years, 1ppd, quit 1975) with PMH of HTN, HLD, DM II, gout, rheumatic fever, systolic murmur, BPH, renal carcinoma s/p L nephrectomy, CAD s/p CABG in 1991 and hyperglycemia who presented to [facility] on 7/14 with a 6 month history of shortness of breath.    The patient reports he has had mild SOB for 6 months with increased lower extremity swelling.  Specifically, over the past month he has had daily progression of increasing SOB.  He denies fevers, chills, chest pain, sputum production, hemoptysis, syncope.  He notes progressive fatigue and decreased activity tolerance.  Patient denies weight loss or change in appetite.  He denies falls, taking anticoagulation or chest trauma.    He is widowed, lives alone and is independent with ADL's. He continues to drive.  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  Served in [employer] but no known exposures from work or [employer] history.    PAST MEDICAL HISTORY :   has a past medical history of CAD (coronary artery disease); Hypertension; Hyperlipidemia; Rheumatic fever ("I was an infant"); Hyperglycemia; BPH (benign prostatic hyperplasia); Heart murmur (dx'd 1958); Type II diabetes mellitus (dx'd ~ 04/2014); History of gout; and Kidney carcinoma (1999).  has past surgical history that includes Coronary artery bypass graft  (1991); Nephrectomy (Left, 1999); Vasectomy; Tonsillectomy and adenoidectomy; Colonoscopy (2010); US echocardiography (07/14/2009); Cataract extraction w/ intraocular lens  implant, bilateral (Bilateral, 2000's); and Cardiac catheterization (1991).    Prior to Admission medications   Medication Sig Start Date End Date Taking? Authorizing Provider  aspirin 81 MG tablet Take 81 mg by mouth every evening.    Yes Historical Provider, MD  calcium carbonate (OS-CAL) 600 MG TABS Take 600 mg by mouth daily. Vit D 3    Yes Historical Provider, MD  ferrous sulfate 325 (65 FE) MG tablet TAKE 1 TABLET BY MOUTH ONCE DAILY WITH BREAKFAST 01/23/14  Yes Laurey Morale, MD  fish oil-omega-3 fatty acids 1000 MG capsule Take 2 g by mouth daily.     Yes Historical Provider, MD  furosemide (LASIX) 40 MG tablet Take 1 tablet (40 mg total) by mouth 3 (three) times a week. 07/23/14  Yes Mihai Croitoru, MD  lisinopril (PRINIVIL,ZESTRIL) 40 MG tablet Take 40 mg by mouth daily.     Yes Historical Provider, MD  metoprolol tartrate (LOPRESSOR) 25 MG tablet Take 25 mg by mouth 2 (two) times daily.     Yes Historical Provider, MD  Multiple Vitamin (MULTIVITAMIN) tablet Take 1 tablet by mouth daily.     Yes Historical Provider, MD  Multiple Vitamins-Minerals (PRESERVISION/LUTEIN) CAPS Take by mouth daily. Take 2 capsules every day   Yes Historical Provider, MD  naproxen sodium (ANAPROX) 220 MG tablet Take 220 mg by mouth 2 (two) times daily as needed (  pain).   Yes Historical Provider, MD  simvastatin (ZOCOR) 20 MG tablet Take 20 mg by mouth at bedtime.     Yes Historical Provider, MD  Tamsulosin HCl (FLOMAX) 0.4 MG CAPS Take 0.4 mg by mouth daily.   Yes Historical Provider, MD  vitamin C (ASCORBIC ACID) 500 MG tablet Take 1,000 mg by mouth daily.    Yes Historical Provider, MD   Allergies  Allergen Reactions  . Amoxicillin     rash    FAMILY HISTORY:  family history includes Diabetes in his father; Heart disease in his father;  Hyperlipidemia in his father; Hypertension in his father.   SOCIAL HISTORY:  reports that he quit smoking about 41 years ago. His smoking use included Cigarettes. He has a 15 pack-year smoking history. He has never used smokeless tobacco. He reports that he does not drink alcohol or use illicit drugs.  REVIEW OF SYSTEMS:   Constitutional: Negative for fever, chills, weight loss, malaise/fatigue and diaphoresis.  HENT: Negative for hearing loss, ear pain, nosebleeds, congestion, sore throat, neck pain, tinnitus and ear discharge.   Eyes: Negative for blurred vision, double vision, photophobia, pain, discharge and redness.  Respiratory: Negative for cough, hemoptysis, sputum production, wheezing and stridor.  Reports progressive SOB Cardiovascular: Negative for chest pain, palpitations, orthopnea, claudication, and PND. Reports lower extremity swelling.  Denies abdominal swelling, orthopnea  Gastrointestinal: Negative for heartburn, nausea, vomiting, abdominal pain, diarrhea, constipation, blood in stool and melena.  Genitourinary: Negative for dysuria, urgency, frequency, hematuria and flank pain.  Musculoskeletal: Negative for myalgias, back pain, joint pain and falls.  Skin: Negative for itching and rash.  Neurological: Negative for dizziness, tingling, tremors, sensory change, speech change, focal weakness, seizures, loss of consciousness, weakness and headaches.  Endo/Heme/Allergies: Negative for environmental allergies and polydipsia. Does not bruise/bleed easily.   SUBJECTIVE:   VITAL SIGNS: Temp:  [97.9 F (36.6 C)-98.3 F (36.8 C)] 97.9 F (36.6 C) (07/14 0422) Pulse Rate:  [64-84] 69 (07/14 0422) Resp:  [16-22] 18 (07/14 0422) BP: (135-198)/(58-70) 146/68 mmHg (07/14 0422) SpO2:  [94 %-98 %] 98 % (07/14 0422) Weight:  [213 lb 6.5 oz (96.8 kg)-229 lb (103.874 kg)] 219 lb 9.6 oz (99.61 kg) (07/14 0422)  PHYSICAL EXAMINATION: General:  wdwn adult male in NAD, non-toxic appearing   Neuro:  AAOx4, speech clear, MAE HEENT:  MM pink/moist, no jvd, no bruits Cardiovascular:  s1s2 rrr, 1/6 SEM without radiation to carotids Lungs:  resp's even/non-labored, clear on R, bronchial breath sounds on left lower posterior, dullness to percussion  Abdomen:  Obese/soft, NTND, bsx4 active  Musculoskeletal:  No acute deformities  Skin:  Warm/dry, 1 edema    Recent Labs Lab 07/30/14 1204 07/31/14 0355  NA 141 138  K 5.0 5.2*  CL 108 105  CO2 27 25  BUN 39* 38*  CREATININE 1.55* 1.37*  GLUCOSE 188* 129*    Recent Labs Lab 07/30/14 1204  HGB 10.2*  HCT 32.4*  WBC 10.5  PLT 251   Dg Chest 2 View  07/30/2014   CLINICAL DATA:  Shortness of breath.  EXAM: CHEST  2 VIEW  COMPARISON:  Report from 07/04/2002  FINDINGS: Large left pleural effusion occupying 75% of left hemithoracic volume. Multiple old left rib deformities.  Prior CABG. Atherosclerotic aortic arch. Suspected enlargement of the cardiopericardial silhouette (left heart border obscured).  The right lung appears clear.  Thoracic spondylosis noted.  IMPRESSION: 1. Large left pleural effusion with passive atelectasis. The effusion occupies about 75% of  left hemithoracic volume. 2. Suspected enlargement of the cardiopericardial silhouette but no overt edema. Prior CABG. 3. Atherosclerotic aortic arch.   Electronically Signed   By: Van Clines M.D.   On: 07/30/2014 12:47    ASSESSMENT / PLAN:  Left Pleural Effusion - large left effusion, free flowing on bedside US assessment.  CXR shows small area on L upper that appears loculated / rounded.  DDx includes CHF (diastolic dysfunction - endorses LE swelling, progressive in nature), parapneumonic (although no subjective hx to support infectious etiology), and malignancy (hx of renal carcinoma s/p nephrectomy, "clean" follow up visits).     Plan: Bedside US assessment  Proceed with ultrasound guided thoracentesis  Send pleural / serum studies for:  LDH, protein, cell  count with differential, cytology, culture CXR post procedure CT of chest without contrast to evaluate parenchyma/pleural space for possible underlying mass Pulmonary hygiene:  IS, mobilize   Noe Gens, NP-C [facility] Pgr: [phone] or if no answer [phone] 07/31/2014, 9:43 AM   STAFF NOTE: I, Merrie Roof, MD FACP have personally reviewed patient's available data, including medical history, events of note, physical examination and test results as part of my evaluation. I have discussed with resident/NP and other care providers such as pharmacist, RN and RRT. In addition, I personally evaluated patient and elicited key findings of: sys murmur early 3/6, bronchial BS left base, dull to percussion, egophony left base, no distress, jvd present, etiology of effusions unclear, worry for malignancy but hope for valvular dz / diastolic CHF as cause, requires diag / there thora, will assess coags then tap, have updated daughter and pt on procedure needs, his renal cancer is remote and has been cancer free, will send for cytology, post thora will CT chest without contrast to assess for mass and apical area note on pcxr, neg balance goals per cards with lasix  Lavon Paganini. Titus Mould, MD, [place] Pgr: [facility] 07/31/2014 10:42 AM

## 2014-07-31 NOTE — Progress Notes (Signed)
Reeived report on John Potter. Pt alert and oriented x 4.  Denies pain.  Had bedside Left Thoracentesis via left back - band aid intact to puncture site, no edema or drainage from site.

## 2014-07-31 NOTE — Progress Notes (Signed)
Patient Profile: 80 y/o male with h/o CAD s/p CABG in 1991, HTN, HLD, DM, mild AS and normal LVF on recent echo, presenting with worsening DOE. Found to have large left-sided pleural effusion on CXR.  Subjective: Asymptomatic at rest. Still with DOE. No chest pain.   Objective: Vital signs in last 24 hours: Temp:  [97.9 F (36.6 C)-98.3 F (36.8 C)] 97.9 F (36.6 C) (07/14 0422) Pulse Rate:  [64-84] 69 (07/14 0422) Resp:  [16-22] 18 (07/14 0422) BP: (135-198)/(58-70) 146/68 mmHg (07/14 0422) SpO2:  [94 %-98 %] 98 % (07/14 0422) Weight:  [213 lb 6.5 oz (96.8 kg)-229 lb (103.874 kg)] 219 lb 9.6 oz (99.61 kg) (07/14 0422) Last BM Date: 07/30/14  Intake/Output from previous day: 07/13 0701 - 07/14 0700 In: -  Out: 425 [Urine:425] Intake/Output this shift:    Medications Current Facility-Administered Medications  Medication Dose Route Frequency Provider Last Rate Last Dose  . acetaminophen (TYLENOL) tablet 650 mg  650 mg Oral Q6H PRN Delfina Redwood, MD       Or  . acetaminophen (TYLENOL) suppository 650 mg  650 mg Rectal Q6H PRN Delfina Redwood, MD      . alum & mag hydroxide-simeth (MAALOX/MYLANTA) 200-200-20 MG/5ML suspension 30 mL  30 mL Oral Q6H PRN Delfina Redwood, MD      . aspirin EC tablet 81 mg  81 mg Oral QPM Delfina Redwood, MD   81 mg at 07/30/14 2236  . calcium carbonate (OS-CAL - dosed in mg of elemental calcium) tablet 500 mg of elemental calcium  1 tablet Oral Q breakfast Delfina Redwood, MD      . ferrous sulfate tablet 325 mg  325 mg Oral Q breakfast Delfina Redwood, MD      . furosemide (LASIX) tablet 40 mg  40 mg Oral Daily Delfina Redwood, MD      . magnesium hydroxide (MILK OF MAGNESIA) suspension 30 mL  30 mL Oral Daily PRN Delfina Redwood, MD      . metoprolol tartrate (LOPRESSOR) tablet 25 mg  25 mg Oral BID Delfina Redwood, MD   25 mg at 07/30/14 2236  . ondansetron (ZOFRAN) tablet 4 mg  4 mg Oral Q6H PRN Delfina Redwood, MD       Or  . ondansetron [facility]) injection 4 mg  4 mg Intravenous Q6H PRN Delfina Redwood, MD      . simvastatin (ZOCOR) tablet 20 mg  20 mg Oral QHS Delfina Redwood, MD   20 mg at 07/30/14 2236  . sodium polystyrene (KAYEXALATE) 15 GM/60ML suspension 30 g  30 g Oral Once Thurnell Lose, MD      . tamsulosin (FLOMAX) capsule 0.4 mg  0.4 mg Oral Daily Delfina Redwood, MD        PE: General appearance: alert, cooperative and no distress Neck: no carotid bruit and no JVD Lungs: decrease BS over left lung base, otherwise clear Heart: regular rate and rhythm and 1/6 SM (AS) Extremities: trace bilateral LEE Pulses: 2+ and symmetric Skin: warm and dry Neurologic: Grossly normal  Lab Results:   Recent Labs  07/30/14 1204  WBC 10.5  HGB 10.2*  HCT 32.4*  PLT 251   BMET  Recent Labs  07/30/14 1204 07/31/14 0355  NA 141 138  K 5.0 5.2*  CL 108 105  CO2 27 25  GLUCOSE 188* 129*  BUN 39* 38*  CREATININE 1.55* 1.37*  CALCIUM 10.6* 10.3   Cardiac Panel (last 3 results) No results for input(s): CKTOTAL, CKMB, TROPONINI, RELINDX in the last 72 hours.  Studies/Results: CXR 07/30/14 FINDINGS: Large left pleural effusion occupying 75% of left hemithoracic volume. Multiple old left rib deformities.  Prior CABG. Atherosclerotic aortic arch. Suspected enlargement of the cardiopericardial silhouette (left heart border obscured).  The right lung appears clear. Thoracic spondylosis noted.  IMPRESSION: 1. Large left pleural effusion with passive atelectasis. The effusion occupies about 75% of left hemithoracic volume. 2. Suspected enlargement of the cardiopericardial silhouette but no overt edema. Prior CABG. 3. Atherosclerotic aortic arch.  Assessment/Plan  Principal Problem:   DOE (dyspnea on exertion) Active Problems:   CAD (coronary artery disease)   Hyperlipidemia   Essential hypertension   LBBB (left bundle branch block)   Pleural effusion    Pleural effusion on left  1. Left Pleural Effusion: US guided thoracentesis of pleural space by IR has been ordered.   2. DOE: large pleural effusion likely contributing. Will assess response to therapy. If he continues to have DOE after treatment of his pleural effusion, will proceed with stress testing to r/o myocardial ischemia.   3. Diastolic CHF: recent echo 6/16 showed normal LVF. Volume overloaded on admit. Received IV lasix yesterday. I/Os not recorded, but volume improved and patient reports good UOP. Still with mild LEE. Continue Lasix.   4. CAD: h/o CABG in 1991. No chest pain but with DOE (see plan above). NST pending response to treatment. Continue medical therapy for now: ASA, BB and statin.      LOS: 1 day    Brittainy M. Ladoris Gene 07/31/2014 7:48 AM   I have personally seen and examined this patient with Lyda Jester, PA-C. I agree with the assessment and plan as outlined above. He has diuresed well overnight but no I/O available. Will follow strict I/O. Still volume overloaded so will continue IV Lasix. Plans per pulmonary team for thoracentesis today. No ischemic cardiac evaluation planned at this time.   MCALHANY,CHRISTOPHER 07/31/2014 9:10 AM

## 2014-07-31 NOTE — Progress Notes (Signed)
Pt c/o weakness prior to having a BM that "eased off" after he had gone to the bathroom.  Felt as though it may be from the [place] he received.  VS normal, CBG was normal.  Notified MD and will continue to monitor.

## 2014-08-01 ENCOUNTER — Inpatient Hospital Stay (HOSPITAL_COMMUNITY): Admission: RE | Admit: 2014-08-01 | Payer: Medicare Other | Source: Ambulatory Visit

## 2014-08-01 DIAGNOSIS — J948 Other specified pleural conditions: Secondary | ICD-10-CM

## 2014-08-01 DIAGNOSIS — R0609 Other forms of dyspnea: Secondary | ICD-10-CM

## 2014-08-01 LAB — BASIC METABOLIC PANEL
Anion gap: 8 (ref 5–15)
BUN: 35 mg/dL — AB (ref 6–20)
CHLORIDE: 103 mmol/L (ref 101–111)
CO2: 26 mmol/L (ref 22–32)
Calcium: 9.7 mg/dL (ref 8.9–10.3)
Creatinine, Ser: 1.4 mg/dL — ABNORMAL HIGH (ref 0.61–1.24)
GFR, EST AFRICAN AMERICAN: 54 mL/min — AB (ref >60)
GFR, EST NON AFRICAN AMERICAN: 47 mL/min — AB (ref >60)
Glucose, Bld: 128 mg/dL — ABNORMAL HIGH (ref 65–99)
Potassium: 4.4 mmol/L (ref 3.5–5.1)
Sodium: 137 mmol/L (ref 135–145)

## 2014-08-01 LAB — PH, BODY FLUID: pH, Fluid: 8.5

## 2014-08-01 LAB — GLUCOSE, CAPILLARY: Glucose-Capillary: 133 mg/dL — ABNORMAL HIGH (ref 65–99)

## 2014-08-01 MED ORDER — LISINOPRIL 5 MG PO TABS: 5.0000 mg | ORAL_TABLET | Freq: Every day | ORAL | Status: DC

## 2014-08-01 NOTE — Discharge Summary (Signed)
John Potter, is a 80 y.o. male  DOB August 26, 1934  MRN [id number].  Admission date:  07/30/2014  Admitting Physician  Delfina Redwood, MD  Discharge Date:  08/01/2014   Primary MD  Laurey Morale, MD  Recommendations for primary care physician for things to follow:   Must follow with pulmonary for bronchoscopy and lung biopsy.  Monitor blood pressure, BMP closely   Admission Diagnosis  SOB (shortness of breath) [R06.02] Pleural effusion [J90] Pleural effusion on left [J94.8] Other fatigue [R53.83]   Discharge Diagnosis  SOB (shortness of breath) [R06.02] Pleural effusion [J90] Pleural effusion on left [J94.8] Other fatigue [R53.83]    Principal Problem:   DOE (dyspnea on exertion) Active Problems:   CAD (coronary artery disease)   Hyperlipidemia   Essential hypertension   LBBB (left bundle branch block)   Pleural effusion   Pleural effusion on left   Acute on chronic diastolic CHF (congestive heart failure), NYHA class 3      Past Medical History  Diagnosis Date  . CAD (coronary artery disease)     sees Dr. Dani Gobble Croitoru   . Hypertension   . Hyperlipidemia   . Rheumatic fever "I was an infant"  . Hyperglycemia   . BPH (benign prostatic hyperplasia)     sees Dr. Junious Silk   . Heart murmur dx'd 1958  . Type II diabetes mellitus dx'd ~ 04/2014    "mild; advised by MD to lose weight; no RX" (07/30/2014)  . History of gout   . Kidney carcinoma 1999    sees Dr. Junious Silk     Past Surgical History  Procedure Laterality Date  . Coronary artery bypass graft  1991    CABG X 3; per Dr. Redmond Pulling   . Nephrectomy Left 1999     per Dr. Hessie Diener   . Vasectomy    . Tonsillectomy and adenoidectomy    . Colonoscopy  2010    clear, repeat in 10 yrs   . US echocardiography  07/14/2009    mod-severe  LVH,EF =>55%,LA mildly dilated,mild mitral & aortic annular ca+, AOV mod. sclerotic,discrete nodular thickening of the non-coronary cusp  . Cataract extraction w/ intraocular lens  implant, bilateral Bilateral 2000's  . Cardiac catheterization  1991       HPI  from the history and physical done on the day of admission:    John Potter is a 80 y.o. male with h/o CAD, CABG, diet controlled DM, CKD, HTN, presents with worsening DOE and bilateral leg edema over the past several months. Recent echo with normal EF. Started on lasix by cardiology recently without improvement in symptoms. No CP, orthopnea, palpitations, fever. CXR in ED shows new left pleural effusion. BNP not high. Scheduled for outpt myoview this Friday. EDP consulted cardiology who have evaluated patient and recommend admission to medicine for workup of pleural effusion and possible further cardiac testing. TSH in March normal.      Hospital Course:     1. Exertional shortness of breath due  to large left-sided pleural effusion - initially thought to be due to CHF and transudative, however fluid from paracentesis done by pulmonary on 07/31/2014 looks bloody and exudative. CT chest now shows a mass. Will wait for cytology results. Pulmonary on board. Patient told that he might have lung cancer. Shortness of breath has resolved and he is oxygen free.  Was seen today by pulmonary and they have cleared him for home discharge, they will do bronchoscopy in the next few days in the outpatient setting for lung biopsy and further diagnosis. Based on biopsy results may require oncology follow-up as well.   2. Mild Acute on chronic diastolic CHF. EF 55%. Was followed by cardiology, on beta blocker, Lasix continued. until any outpatient follow-up with PCP and cardiology.     3. BPH. On Flomax continue.    4. Dyslipidemia. On home dose statin.    5.HTN - B blocker home dose.    6.Hyperkalemia -  resolved after holding ACE  inhibitor and Kayexalate, reduce ACE inhibitor dose upon discharge. Request PCP to monitor BMP closely.   7.CKD 3 - close to baseline      Discharge Condition: Fair  Follow UP  Follow-up Information    Follow up with PARRETT,TAMMY, NP On 08/01/2014.   Specialty:  Nurse Practitioner   Contact information:   [address] [address] [phone]       Follow up with Laurey Morale, MD. Schedule an appointment as soon as possible for a visit in 1 week.   Specialty:  Family Medicine   Contact information:   [place] [id number] [phone]        Consults obtained - Cards, Pulmonary  Diet and Activity recommendation: See Discharge Instructions below  Discharge Instructions       Discharge Instructions    Diet - low sodium heart healthy    Complete by:  As directed      Discharge instructions    Complete by:  As directed   Follow with Primary MD Laurey Morale, MD and the Lung Doctor in 7 days   Get CBC, CMP, 2 view Chest X ray checked  by Primary MD next visit.    Activity: As tolerated with Full fall precautions use walker/cane & assistance as needed   Disposition Home     Diet: Heart Healthy  .  For Heart failure patients - Check your Weight same time everyday, if you gain over 2 pounds, or you develop in leg swelling, experience more shortness of breath or chest pain, call your Primary MD immediately. Follow Cardiac Low Salt Diet and 1.5 lit/day fluid restriction.   On your next visit with your primary care physician please Get Medicines reviewed and adjusted.   Please request your Prim.MD to go over all Hospital Tests and Procedure/Radiological results at the follow up, please get all Hospital records sent to your Prim MD by signing hospital release before you go home.   If you experience worsening of your admission symptoms, develop shortness of breath, life threatening emergency, suicidal or homicidal thoughts you  must seek medical attention immediately by calling 911 or calling your MD immediately  if symptoms less severe.  You Must read complete instructions/literature along with all the possible adverse reactions/side effects for all the Medicines you take and that have been prescribed to you. Take any new Medicines after you have completely understood and accpet all the possible adverse reactions/side effects.   Do not drive, operating heavy  machinery, perform activities at heights, swimming or participation in water activities or provide baby sitting services if your were admitted for syncope or siezures until you have seen by Primary MD or a Neurologist and advised to do so again.  Do not drive when taking Pain medications.    Do not take more than prescribed Pain, Sleep and Anxiety Medications  Special Instructions: If you have smoked or chewed Tobacco  in the last 2 yrs please stop smoking, stop any regular Alcohol  and or any Recreational drug use.  Wear Seat belts while driving.   Please note  You were cared for by a hospitalist during your hospital stay. If you have any questions about your discharge medications or the care you received while you were in the hospital after you are discharged, you can call the unit and asked to speak with the hospitalist on call if the hospitalist that took care of you is not available. Once you are discharged, your primary care physician will handle any further medical issues. Please note that NO REFILLS for any discharge medications will be authorized once you are discharged, as it is imperative that you return to your primary care physician (or establish a relationship with a primary care physician if you do not have one) for your aftercare needs so that they can reassess your need for medications and monitor your lab values.     Increase activity slowly    Complete by:  As directed              Discharge Medications       Medication List    STOP  taking these medications        naproxen sodium 220 MG tablet  Commonly known as:  ANAPROX      TAKE these medications        aspirin 81 MG tablet  Take 81 mg by mouth every evening.     calcium carbonate 600 MG Tabs tablet  Commonly known as:  OS-CAL  Take 600 mg by mouth daily. Vit D 3     ferrous sulfate 325 (65 FE) MG tablet  TAKE 1 TABLET BY MOUTH ONCE DAILY WITH BREAKFAST     fish oil-omega-3 fatty acids 1000 MG capsule  Take 2 g by mouth daily.     furosemide 40 MG tablet  Commonly known as:  LASIX  Take 1 tablet (40 mg total) by mouth 3 (three) times a week.     lisinopril 5 MG tablet  Commonly known as:  PRINIVIL  Take 1 tablet (5 mg total) by mouth daily.     metoprolol tartrate 25 MG tablet  Commonly known as:  LOPRESSOR  Take 25 mg by mouth 2 (two) times daily.     multivitamin tablet  Take 1 tablet by mouth daily.     PRESERVISION/LUTEIN Caps  Take by mouth daily. Take 2 capsules every day     simvastatin 20 MG tablet  Commonly known as:  ZOCOR  Take 20 mg by mouth at bedtime.     tamsulosin 0.4 MG Caps capsule  Commonly known as:  FLOMAX  Take 0.4 mg by mouth daily.     vitamin C 500 MG tablet  Commonly known as:  ASCORBIC ACID  Take 1,000 mg by mouth daily.        Major procedures and Radiology Reports - PLEASE review detailed and final reports for all details, in brief -     Ultrasound-guided thoracentesis done by  pulmonary on 07/31/2014. Looks exudative and bloody. Cytology pending.  TTE 06-2014  - Left ventricle: The cavity size was normal. Wall thickness was increased in a pattern of moderate LVH. Systolic function wasnormal. The estimated ejection fraction was in the range of 55%to 60%. - Aortic valve: There was mild stenosis. Valve area (VTI): 1.43cm^2. Valve area (Vmax): 1.35 cm^2. Valve area (Vmean): 1.38cm^2. - Left atrium: The atrium was mildly dilated. - Atrial septum: There was increased thickness of the  septum,consistent with lipomatous hypertrophy. No defect or patentforamen ovale was identified.   Dg Chest 2 View  07/30/2014   CLINICAL DATA:  Shortness of breath.  EXAM: CHEST  2 VIEW  COMPARISON:  Report from 07/04/2002  FINDINGS: Large left pleural effusion occupying 75% of left hemithoracic volume. Multiple old left rib deformities.  Prior CABG. Atherosclerotic aortic arch. Suspected enlargement of the cardiopericardial silhouette (left heart border obscured).  The right lung appears clear.  Thoracic spondylosis noted.  IMPRESSION: 1. Large left pleural effusion with passive atelectasis. The effusion occupies about 75% of left hemithoracic volume. 2. Suspected enlargement of the cardiopericardial silhouette but no overt edema. Prior CABG. 3. Atherosclerotic aortic arch.   Electronically Signed   By: Van Clines M.D.   On: 07/30/2014 12:47   Ct Chest Wo Contrast  07/31/2014   CLINICAL DATA:  Evaluate pleural effusion  EXAM: CT CHEST WITHOUT CONTRAST  TECHNIQUE: Multidetector CT imaging of the chest was performed following the standard protocol without IV contrast.  COMPARISON:  Chest radiograph dated 07/31/2014  FINDINGS: Mediastinum/Nodes: Heart is normal in size. No pericardial effusion.  Coronary atherosclerosis. Postsurgical changes related to prior CABG.  Atherosclerotic calcifications of the aortic arch.  Thoracic lymphadenopathy, including:  --14 mm short axis node at the left thoracic inlet (series 21/image 13)  --11 mm short axis AP window node (series 21/image 23)  --13 mm short axis prevascular node (series 21/image 25)  --Abnormal soft tissue in the left hilar region (series 21/image 28)  --17 mm short axis subcarinal node (series 21/image 31)  2.3 cm left inferior thyroid nodule (series 21/image 13).  Lungs/Pleura: 11.3 x 8.8 cm left lower lobe mass (series 21/ image 42) extending to the left infrahilar/ perihilar region.  Associated small to moderate loculated left pleural effusion,  likely malignant.  Mild branching nodularity in the posterior right upper lobe (series 203/images 24 and 25), suspicious for endobronchial spread of infection or tumor.  No pneumothorax status post thoracentesis.  Upper abdomen: Visualized upper abdomen is notable for two nodes at the GE junction measuring up to 14 mm short axis (series 201/ image 49), cholelithiasis, and surgical clips in the left upper abdomen.  Musculoskeletal: Degenerative changes of the visualized thoracolumbar spine.  IMPRESSION: 11.3 x 8.8 cm left lower lobe mass extending to the left infrahilar/ perihilar region, compatible with primary bronchogenic neoplasm.  Associated small to moderate loculated left pleural effusion, likely malignant.  Mild branching nodularity in the posterior right upper lobe, suspicious for endobronchial spread of infection or tumor.  Thoracic and upper abdominal nodal metastases, as above.  No pneumothorax.   Electronically Signed   By: Julian Hy M.D.   On: 07/31/2014 17:48   Dg Chest Port 1 View  07/31/2014   CLINICAL DATA:  Status post left thoracentesis. Patient symptomatically improved.  EXAM: PORTABLE CHEST - 1 VIEW  COMPARISON:  07/30/2014.  FINDINGS: There is decreased opacity throughout the mid and lower lung consistent with significant decreased pleural fluid. Residual opacity may reflect  pneumonia or inflammation, atelectasis or a combination.  There is no pneumothorax. Right lung remains clear. Stable left upper lobe mass like opacity.  IMPRESSION: 1. Significant decrease in pleural fluid on the left following thoracentesis. No pneumothorax.   Electronically Signed   By: Lajean Manes M.D.   On: 07/31/2014 14:27    Micro Results      Recent Results (from the past 240 hour(s))  Body fluid culture (includes gram stain)     Status: None (Preliminary result)   Collection Time: 07/31/14 12:13 PM  Result Value Ref Range Status   Specimen Description FLUID PLEURAL LEFT  Final   Special  Requests NONE  Final   Gram Stain   Final    RARE WBC PRESENT, PREDOMINANTLY MONONUCLEAR NO ORGANISMS SEEN    Culture NO GROWTH < 24 HOURS  Final   Report Status PENDING  Incomplete  Fungus Culture with Smear     Status: None (Preliminary result)   Collection Time: 07/31/14 12:15 PM  Result Value Ref Range Status   Specimen Description PLEURAL FLUID  Final   Special Requests NONE  Final   Fungal Smear   Final    NO YEAST OR FUNGAL ELEMENTS SEEN Performed at [facility]    Culture   Final    CULTURE IN PROGRESS FOR FOUR WEEKS Performed at [facility]    Report Status PENDING  Incomplete       Today   Braggs Trostel today has no headache,no chest abdominal pain,no new weakness tingling or numbness, feels much better wants to go home today.     Objective   Blood pressure 157/67, pulse 64, temperature 98.3 F (36.8 C), temperature source Oral, resp. rate 18, height '5\' 8"'$  (1.727 m), weight 98.612 kg (217 lb 6.4 oz), SpO2 97 %.   Intake/Output Summary (Last 24 hours) at 08/01/14 1250 Last data filed at 08/01/14 0730  Gross per 24 hour  Intake    840 ml  Output   1250 ml  Net   -410 ml    Exam Awake Alert, Oriented x 3, No new F.N deficits, Normal affect [place].AT,PERRAL Supple Neck,No JVD, No cervical lymphadenopathy appriciated.  Symmetrical Chest wall movement, Good air movement bilaterally, CTAB RRR,No Gallops,Rubs or new Murmurs, No Parasternal Heave +ve B.Sounds, Abd Soft, Non tender, No organomegaly appriciated, No rebound -guarding or rigidity. No Cyanosis, Clubbing or edema, No new Rash or bruise   Data Review   CBC w Diff: Lab Results  Component Value Date   WBC 10.5 07/30/2014   HGB 10.2* 07/30/2014   HCT 32.4* 07/30/2014   PLT 251 07/30/2014   LYMPHOPCT 28.2 03/20/2014   MONOPCT 7.7 03/20/2014   EOSPCT 2.3 03/20/2014   BASOPCT 0.6 03/20/2014    CMP: Lab Results  Component Value Date   NA 137 08/01/2014   K 4.4  08/01/2014   CL 103 08/01/2014   CO2 26 08/01/2014   BUN 35* 08/01/2014   CREATININE 1.40* 08/01/2014   CREATININE 1.51* 07/07/2014   PROT 7.0 07/31/2014   ALBUMIN 2.6* 07/31/2014   BILITOT 0.3 07/31/2014   ALKPHOS 49 07/31/2014   AST 30 07/31/2014   ALT 28 07/31/2014  .   Total Time in preparing paper work, data evaluation and todays exam - 35 minutes  Thurnell Lose M.D on 08/01/2014 at 12:50 PM  [facility]   Office  [phone]

## 2014-08-01 NOTE — Progress Notes (Signed)
Name: John Potter MRN: [id number] DOB: Nov 08, 1934    ADMISSION DATE:  07/30/2014 CONSULTATION DATE:  07/31/14  REFERRING MD :  Dr. Candiss Norse   CHIEF COMPLAINT:  SOB, Abnormal CXR   BRIEF PATIENT DESCRIPTION: 80 y/o M admitted 7/13 with a 6 month history of mild SOB, progressive over past 1 month PTA, lower extremity swelling and fatigue.  Work up notable for left pleural effusion and PCCM consulted for evaluation.   SIGNIFICANT EVENTS  7/13  Admit with SOB, CXR with L pleural effusion 7/14  L thoracentesis >> 1.2L rust colored fluid removed   STUDIES:  6/17  ECHO >> LVEF 55-60%, mild AS, mild LA dilation, no PFO  SUBJECTIVE / Interval Hx:  CT chest reviewed by Dr Lamonte Sakai personally  No complaints  VITAL SIGNS: Temp:  [97.4 F (36.3 C)-98.8 F (37.1 C)] 98.3 F (36.8 C) (07/15 0446) Pulse Rate:  [64-80] 64 (07/15 0446) Resp:  [18] 18 (07/15 0446) BP: (126-157)/(48-70) 157/67 mmHg (07/15 0446) SpO2:  [94 %-97 %] 97 % (07/15 0446) Weight:  [217 lb 6.4 oz (98.612 kg)] 217 lb 6.4 oz (98.612 kg) (07/15 0446)  PHYSICAL EXAMINATION: General:  wdwn adult male in NAD, non-toxic appearing  Neuro:  AAOx4, speech clear, MAE HEENT:  MM pink/moist, no jvd, no bruits Cardiovascular:  s1s2 rrr, 1/6 SEM without radiation to carotids Lungs:  resp's even/non-labored, clear on R, bronchial breath sounds on left lower posterior, dullness to percussion  Abdomen:  Obese/soft, NTND, bsx4 active  Musculoskeletal:  No acute deformities  Skin:  Warm/dry, 1 edema    Recent Labs Lab 07/30/14 1204 07/31/14 0355 08/01/14 0403  NA 141 138 137  K 5.0 5.2* 4.4  CL 108 105 103  CO2 '27 25 26  '$ BUN 39* 38* 35*  CREATININE 1.55* 1.37* 1.40*  GLUCOSE 188* 129* 128*    Recent Labs Lab 07/30/14 1204  HGB 10.2*  HCT 32.4*  WBC 10.5  PLT 251   Dg Chest 2 View  07/30/2014   CLINICAL DATA:  Shortness of breath.  EXAM: CHEST  2 VIEW  COMPARISON:  Report from 07/04/2002  FINDINGS: Large left pleural  effusion occupying 75% of left hemithoracic volume. Multiple old left rib deformities.  Prior CABG. Atherosclerotic aortic arch. Suspected enlargement of the cardiopericardial silhouette (left heart border obscured).  The right lung appears clear.  Thoracic spondylosis noted.  IMPRESSION: 1. Large left pleural effusion with passive atelectasis. The effusion occupies about 75% of left hemithoracic volume. 2. Suspected enlargement of the cardiopericardial silhouette but no overt edema. Prior CABG. 3. Atherosclerotic aortic arch.   Electronically Signed   By: Van Clines M.D.   On: 07/30/2014 12:47   Ct Chest Wo Contrast  07/31/2014   CLINICAL DATA:  Evaluate pleural effusion  EXAM: CT CHEST WITHOUT CONTRAST  TECHNIQUE: Multidetector CT imaging of the chest was performed following the standard protocol without IV contrast.  COMPARISON:  Chest radiograph dated 07/31/2014  FINDINGS: Mediastinum/Nodes: Heart is normal in size. No pericardial effusion.  Coronary atherosclerosis. Postsurgical changes related to prior CABG.  Atherosclerotic calcifications of the aortic arch.  Thoracic lymphadenopathy, including:  --14 mm short axis node at the left thoracic inlet (series 21/image 13)  --11 mm short axis AP window node (series 21/image 23)  --13 mm short axis prevascular node (series 21/image 25)  --Abnormal soft tissue in the left hilar region (series 21/image 28)  --17 mm short axis subcarinal node (series 21/image 31)  2.3 cm left inferior  thyroid nodule (series 21/image 13).  Lungs/Pleura: 11.3 x 8.8 cm left lower lobe mass (series 21/ image 42) extending to the left infrahilar/ perihilar region.  Associated small to moderate loculated left pleural effusion, likely malignant.  Mild branching nodularity in the posterior right upper lobe (series 203/images 24 and 25), suspicious for endobronchial spread of infection or tumor.  No pneumothorax status post thoracentesis.  Upper abdomen: Visualized upper abdomen is  notable for two nodes at the GE junction measuring up to 14 mm short axis (series 201/ image 49), cholelithiasis, and surgical clips in the left upper abdomen.  Musculoskeletal: Degenerative changes of the visualized thoracolumbar spine.  IMPRESSION: 11.3 x 8.8 cm left lower lobe mass extending to the left infrahilar/ perihilar region, compatible with primary bronchogenic neoplasm.  Associated small to moderate loculated left pleural effusion, likely malignant.  Mild branching nodularity in the posterior right upper lobe, suspicious for endobronchial spread of infection or tumor.  Thoracic and upper abdominal nodal metastases, as above.  No pneumothorax.   Electronically Signed   By: Julian Hy M.D.   On: 07/31/2014 17:48   Dg Chest Port 1 View  07/31/2014   CLINICAL DATA:  Status post left thoracentesis. Patient symptomatically improved.  EXAM: PORTABLE CHEST - 1 VIEW  COMPARISON:  07/30/2014.  FINDINGS: There is decreased opacity throughout the mid and lower lung consistent with significant decreased pleural fluid. Residual opacity may reflect pneumonia or inflammation, atelectasis or a combination.  There is no pneumothorax. Right lung remains clear. Stable left upper lobe mass like opacity.  IMPRESSION: 1. Significant decrease in pleural fluid on the left following thoracentesis. No pneumothorax.   Electronically Signed   By: Lajean Manes M.D.   On: 07/31/2014 14:27    ASSESSMENT / PLAN:  Left Pleural Effusion and complicated pleural space - large left exudative effusion. Areas of residual loculation, LLL consolidation +/- mass on Ct chest 7/14. Cytology reviewed 7/15 > no malignant cells seen in pleural fluid.  Probable LLL mass  Plan: Several options for next diagnostic steps including FOB to assess LLL, but I would recommend at least reviewing films with TCTS to see if VATS would allow clean-out pleural material and also a dx. We can arrange for FOB >> 11:00am Monday 08/04/14   Noe Gens, NP-C [facility] Pgr: [phone] or if no answer [phone] 08/01/2014, 10:08 AM   Attending Note:  I have examined patient, reviewed labs, studies and notes. I have discussed the case with B Ollis, and I agree with the data and plans as amended above.   Baltazar Apo, MD, PhD 08/01/2014, 12:21 PM [facility] [phone] or if no answer [phone]

## 2014-08-01 NOTE — Progress Notes (Signed)
Pt d/c home. Alert and oriented x4. No c/o pain.  IV D/Cd.  Tele D/Cd.  Pt educated on diet, activity, meds, and follow-up care and appointments (including thoracentesis on Monday).  Pt was also educated on when to activate EMS or call the physician.  Pt verbalized understanding.  Will continue to monitor.

## 2014-08-01 NOTE — Discharge Instructions (Signed)
Follow with Primary MD Laurey Morale, MD and the Lung Doctor in 7 days   Get CBC, CMP, 2 view Chest X ray checked  by Primary MD next visit.    Activity: As tolerated with Full fall precautions use walker/cane & assistance as needed   Disposition Home     Diet: Heart Healthy  .  For Heart failure patients - Check your Weight same time everyday, if you gain over 2 pounds, or you develop in leg swelling, experience more shortness of breath or chest pain, call your Primary MD immediately. Follow Cardiac Low Salt Diet and 1.5 lit/day fluid restriction.   On your next visit with your primary care physician please Get Medicines reviewed and adjusted.   Please request your Prim.MD to go over all Hospital Tests and Procedure/Radiological results at the follow up, please get all Hospital records sent to your Prim MD by signing hospital release before you go home.   If you experience worsening of your admission symptoms, develop shortness of breath, life threatening emergency, suicidal or homicidal thoughts you must seek medical attention immediately by calling 911 or calling your MD immediately  if symptoms less severe.  You Must read complete instructions/literature along with all the possible adverse reactions/side effects for all the Medicines you take and that have been prescribed to you. Take any new Medicines after you have completely understood and accpet all the possible adverse reactions/side effects.   Do not drive, operating heavy machinery, perform activities at heights, swimming or participation in water activities or provide baby sitting services if your were admitted for syncope or siezures until you have seen by Primary MD or a Neurologist and advised to do so again.  Do not drive when taking Pain medications.    Do not take more than prescribed Pain, Sleep and Anxiety Medications  Special Instructions: If you have smoked or chewed Tobacco  in the last 2 yrs please stop  smoking, stop any regular Alcohol  and or any Recreational drug use.  Wear Seat belts while driving.   Please note  You were cared for by a hospitalist during your hospital stay. If you have any questions about your discharge medications or the care you received while you were in the hospital after you are discharged, you can call the unit and asked to speak with the hospitalist on call if the hospitalist that took care of you is not available. Once you are discharged, your primary care physician will handle any further medical issues. Please note that NO REFILLS for any discharge medications will be authorized once you are discharged, as it is imperative that you return to your primary care physician (or establish a relationship with a primary care physician if you do not have one) for your aftercare needs so that they can reassess your need for medications and monitor your lab values.

## 2014-08-01 NOTE — Progress Notes (Signed)
Patient Demographics:    John Potter, is a 80 y.o. male, DOB - 11/14/1934, GGY:[id number]  Admit date - 07/30/2014   Admitting Physician Delfina Redwood, MD  Outpatient Primary MD for the patient is Laurey Morale, MD  LOS - 2   Chief Complaint  Patient presents with  . Shortness of Breath  . Weakness        Subjective:    John Potter today has, No headache, No chest pain, No abdominal pain - No Nausea, No new weakness tingling or numbness, No Cough - Mild exertional SOB.     Assessment  & Plan :     1. Exertional shortness of breath due to large left-sided pleural effusion - initially thought to be due to CHF and transudative, however fluid from paracentesis done by pulmonary on 07/31/2014 looks bloody and exudative. CT chest now shows a mass. Will wait for cytology results. Pulmonary on board. Patient told that he might have lung cancer. Shortness of breath better continue supportive care for now.    2. Mild Acute on chronic diastolic CHF. EF 55%. Being followed by cardiology, on beta blocker, Lasix continued.    3. BPH. On Flomax continue.    4. Dyslipidemia. On home dose statin.    5.HTN - B blocker home days.    6.Hyperkalemia - Hold ACE, Kayexalate.    Code Status : Full  Family Communication  : None present  Disposition Plan  : Home 1-2 days  Consults  : IR, pulmonary  Procedures  :   Ultrasound-guided thoracentesis done by pulmonary on 07/31/2014. Looks exudative and bloody. Cytology pending.  TTE  06-2014  - Left ventricle: The cavity size was normal. Wall thickness was increased in a pattern of moderate LVH. Systolic function wasnormal. The estimated ejection fraction was in the range of 55%to 60%. - Aortic valve: There was mild stenosis. Valve area (VTI):  1.43cm^2. Valve area (Vmax): 1.35 cm^2. Valve area (Vmean): 1.38cm^2. - Left atrium: The atrium was mildly dilated. - Atrial septum: There was increased thickness of the septum,consistent with lipomatous hypertrophy. No defect or patentforamen ovale was identified.   DVT Prophylaxis  :    Heparin - SCDs   Lab Results  Component Value Date   PLT 251 07/30/2014    Inpatient Medications  Scheduled Meds: . aspirin EC  81 mg Oral QPM  . calcium carbonate  1 tablet Oral Q breakfast  . ferrous sulfate  325 mg Oral Q breakfast  . furosemide  40 mg Oral Daily  . heparin subcutaneous  5,000 Units Subcutaneous 3 times per day  . metoprolol tartrate  25 mg Oral BID  . simvastatin  20 mg Oral QHS  . tamsulosin  0.4 mg Oral Daily   Continuous Infusions:  PRN Meds:.acetaminophen **OR** acetaminophen, alum & mag hydroxide-simeth, magnesium hydroxide, ondansetron **OR** ondansetron (ZOFRAN) IV  Antibiotics  :     Anti-infectives    None        Objective:   Filed Vitals:   07/31/14 0422 07/31/14 1359 07/31/14 2014 08/01/14 0446  BP: 146/68 126/48 156/70 157/67  Pulse: 69 69 80 64  Temp: 97.9 F (36.6 C) 97.4 F (36.3 C) 98.8 F (37.1 C) 98.3 F (36.8 C)  TempSrc:  Oral Oral Oral Oral  Resp: '18 18 18 18  '$ Height:      Weight: 99.61 kg (219 lb 9.6 oz)   98.612 kg (217 lb 6.4 oz)  SpO2: 98% 94% 95% 97%    Wt Readings from Last 3 Encounters:  08/01/14 98.612 kg (217 lb 6.4 oz)  07/23/14 103.874 kg (229 lb)  06/30/14 104.962 kg (231 lb 6.4 oz)     Intake/Output Summary (Last 24 hours) at 08/01/14 1110 Last data filed at 08/01/14 0730  Gross per 24 hour  Intake    840 ml  Output   1250 ml  Net   -410 ml     Physical Exam  Awake Alert, Oriented X 3, No new F.N deficits, Normal affect [place].AT,PERRAL Supple Neck,No JVD, No cervical lymphadenopathy appriciated.  Symmetrical Chest wall movement, good air movement on the right side, reduced breath sounds in the left  base RRR,No Gallops,Rubs or new Murmurs, No Parasternal Heave +ve B.Sounds, Abd Soft, No tenderness, No organomegaly appriciated, No rebound - guarding or rigidity. No Cyanosis, Clubbing or edema, No new Rash or bruise     Data Review:   Micro Results Recent Results (from the past 240 hour(s))  Body fluid culture (includes gram stain)     Status: None (Preliminary result)   Collection Time: 07/31/14 12:13 PM  Result Value Ref Range Status   Specimen Description FLUID PLEURAL LEFT  Final   Special Requests NONE  Final   Gram Stain   Final    RARE WBC PRESENT, PREDOMINANTLY MONONUCLEAR NO ORGANISMS SEEN    Culture PENDING  Incomplete   Report Status PENDING  Incomplete    Radiology Reports Dg Chest 2 View  07/30/2014   CLINICAL DATA:  Shortness of breath.  EXAM: CHEST  2 VIEW  COMPARISON:  Report from 07/04/2002  FINDINGS: Large left pleural effusion occupying 75% of left hemithoracic volume. Multiple old left rib deformities.  Prior CABG. Atherosclerotic aortic arch. Suspected enlargement of the cardiopericardial silhouette (left heart border obscured).  The right lung appears clear.  Thoracic spondylosis noted.  IMPRESSION: 1. Large left pleural effusion with passive atelectasis. The effusion occupies about 75% of left hemithoracic volume. 2. Suspected enlargement of the cardiopericardial silhouette but no overt edema. Prior CABG. 3. Atherosclerotic aortic arch.   Electronically Signed   By: Van Clines M.D.   On: 07/30/2014 12:47   Ct Chest Wo Contrast  07/31/2014   CLINICAL DATA:  Evaluate pleural effusion  EXAM: CT CHEST WITHOUT CONTRAST  TECHNIQUE: Multidetector CT imaging of the chest was performed following the standard protocol without IV contrast.  COMPARISON:  Chest radiograph dated 07/31/2014  FINDINGS: Mediastinum/Nodes: Heart is normal in size. No pericardial effusion.  Coronary atherosclerosis. Postsurgical changes related to prior CABG.  Atherosclerotic calcifications  of the aortic arch.  Thoracic lymphadenopathy, including:  --14 mm short axis node at the left thoracic inlet (series 21/image 13)  --11 mm short axis AP window node (series 21/image 23)  --13 mm short axis prevascular node (series 21/image 25)  --Abnormal soft tissue in the left hilar region (series 21/image 28)  --17 mm short axis subcarinal node (series 21/image 31)  2.3 cm left inferior thyroid nodule (series 21/image 13).  Lungs/Pleura: 11.3 x 8.8 cm left lower lobe mass (series 21/ image 42) extending to the left infrahilar/ perihilar region.  Associated small to moderate loculated left pleural effusion, likely malignant.  Mild branching nodularity in the posterior right upper lobe (series 203/images 24 and  25), suspicious for endobronchial spread of infection or tumor.  No pneumothorax status post thoracentesis.  Upper abdomen: Visualized upper abdomen is notable for two nodes at the GE junction measuring up to 14 mm short axis (series 201/ image 49), cholelithiasis, and surgical clips in the left upper abdomen.  Musculoskeletal: Degenerative changes of the visualized thoracolumbar spine.  IMPRESSION: 11.3 x 8.8 cm left lower lobe mass extending to the left infrahilar/ perihilar region, compatible with primary bronchogenic neoplasm.  Associated small to moderate loculated left pleural effusion, likely malignant.  Mild branching nodularity in the posterior right upper lobe, suspicious for endobronchial spread of infection or tumor.  Thoracic and upper abdominal nodal metastases, as above.  No pneumothorax.   Electronically Signed   By: Julian Hy M.D.   On: 07/31/2014 17:48   Dg Chest Port 1 View  07/31/2014   CLINICAL DATA:  Status post left thoracentesis. Patient symptomatically improved.  EXAM: PORTABLE CHEST - 1 VIEW  COMPARISON:  07/30/2014.  FINDINGS: There is decreased opacity throughout the mid and lower lung consistent with significant decreased pleural fluid. Residual opacity may reflect  pneumonia or inflammation, atelectasis or a combination.  There is no pneumothorax. Right lung remains clear. Stable left upper lobe mass like opacity.  IMPRESSION: 1. Significant decrease in pleural fluid on the left following thoracentesis. No pneumothorax.   Electronically Signed   By: Lajean Manes M.D.   On: 07/31/2014 14:27     CBC  Recent Labs Lab 07/30/14 1204  WBC 10.5  HGB 10.2*  HCT 32.4*  PLT 251  MCV 86.9  MCH 27.3  MCHC 31.5  RDW 15.2    Chemistries   Recent Labs Lab 07/30/14 1204 07/31/14 0355 07/31/14 0808 08/01/14 0403  NA 141 138  --  137  K 5.0 5.2*  --  4.4  CL 108 105  --  103  CO2 27 25  --  26  GLUCOSE 188* 129*  --  128*  BUN 39* 38*  --  35*  CREATININE 1.55* 1.37*  --  1.40*  CALCIUM 10.6* 10.3  --  9.7  AST  --   --  30  --   ALT  --   --  28  --   ALKPHOS  --   --  49  --   BILITOT  --   --  0.3  --    ------------------------------------------------------------------------------------------------------------------ estimated creatinine clearance is 49.5 mL/min (by C-G formula based on Cr of 1.4). ------------------------------------------------------------------------------------------------------------------ No results for input(s): HGBA1C in the last 72 hours. ------------------------------------------------------------------------------------------------------------------ No results for input(s): CHOL, HDL, LDLCALC, TRIG, CHOLHDL, LDLDIRECT in the last 72 hours. ------------------------------------------------------------------------------------------------------------------ No results for input(s): TSH, T4TOTAL, T3FREE, THYROIDAB in the last 72 hours.  Invalid input(s): FREET3 ------------------------------------------------------------------------------------------------------------------ No results for input(s): VITAMINB12, FOLATE, FERRITIN, TIBC, IRON, RETICCTPCT in the last 72 hours.  Coagulation profile  Recent Labs Lab  07/31/14 1006  INR 1.37    No results for input(s): DDIMER in the last 72 hours.  Cardiac Enzymes No results for input(s): CKMB, TROPONINI, MYOGLOBIN in the last 168 hours.  Invalid input(s): CK ------------------------------------------------------------------------------------------------------------------ Invalid input(s): POCBNP   Time Spent in minutes 35   SINGH,PRASHANT K M.D on 08/01/2014 at 11:10 AM  Between 7am to 7pm - Pager - [phone]  After 7pm go to [email or web address] - password [facility]  [facility]   Office  [phone]

## 2014-08-01 NOTE — Progress Notes (Signed)
     SUBJECTIVE: Breathing improved post thoracentesis. No chest pain.   BP 157/67 mmHg  Pulse 64  Temp(Src) 98.3 F (36.8 C) (Oral)  Resp 18  Ht '5\' 8"'$  (1.727 m)  Wt 217 lb 6.4 oz (98.612 kg)  BMI 33.06 kg/m2  SpO2 97%  Intake/Output Summary (Last 24 hours) at 08/01/14 0749 Last data filed at 07/31/14 2013  Gross per 24 hour  Intake    480 ml  Output   1150 ml  Net   -670 ml    PHYSICAL EXAM General: Well developed, well nourished, in no acute distress. Alert and oriented x 3.  Psych:  Good affect, responds appropriately Neck: No JVD. No masses noted.  Lungs: Decreased BS left base. Overall clear. No wheezes or rhonci noted.  Heart: RRR with systolic murmur.  Abdomen: Bowel sounds are present. Soft, non-tender.  Extremities: 1+ bilateral lower extremity edema.   LABS: Basic Metabolic Panel:  Recent Labs  07/31/14 0355 08/01/14 0403  NA 138 137  K 5.2* 4.4  CL 105 103  CO2 25 26  GLUCOSE 129* 128*  BUN 38* 35*  CREATININE 1.37* 1.40*  CALCIUM 10.3 9.7   CBC:  Recent Labs  07/30/14 1204  WBC 10.5  HGB 10.2*  HCT 32.4*  MCV 86.9  PLT 251   Current Meds: . aspirin EC  81 mg Oral QPM  . calcium carbonate  1 tablet Oral Q breakfast  . ferrous sulfate  325 mg Oral Q breakfast  . furosemide  40 mg Oral Daily  . heparin subcutaneous  5,000 Units Subcutaneous 3 times per day  . metoprolol tartrate  25 mg Oral BID  . simvastatin  20 mg Oral QHS  . tamsulosin  0.4 mg Oral Daily    ASSESSMENT AND PLAN: 80 yo male with known CAD s/p CABG 25 years ago admitted with dyspnea, large left pleural effusion, no chest pain, mild volume overload.   1. Left Pleural Effusion: US guided thoracentesis by pulmonary 07/31/14.    2. Dyspnea: large pleural effusion likely causing this. Will not plan stress test at this time.    3. Diastolic CHF: recent echo 6/16 showed normal LVEF. Volume overloaded on admit. He has diuresed with IV Lasix and is now on po Lasix. Would  continue Lasix 40 mg po Qdaily.   4. CAD: h/o CABG in 1991. No chest pain but with DOE which is felt to be due to his lung/pleural disease. Continue medical therapy for now: ASA, BB and statin.   5. Aortic stenosis: mild to moderate AS by recent echo, not likely contributing to his presentation.   MCALHANY,CHRISTOPHER  7/15/20167:49 AM

## 2014-08-01 NOTE — Care Management Important Message (Signed)
Important Message  Patient Details  Name: John Potter MRN: [id number] Date of Birth: 1934-06-18   Medicare Important Message Given:  Yes-second notification given    Pricilla Handler 08/01/2014, 11:34 AM

## 2014-08-03 LAB — BODY FLUID CULTURE: Culture: NO GROWTH

## 2014-08-03 LAB — ANA, BODY FLUID: Anti-Nuclear Ab, IgG: NOT DETECTED

## 2014-08-04 ENCOUNTER — Encounter (HOSPITAL_COMMUNITY): Payer: Medicare Other

## 2014-08-04 ENCOUNTER — Encounter (HOSPITAL_COMMUNITY): Payer: Self-pay | Admitting: Respiratory Therapy

## 2014-08-04 ENCOUNTER — Ambulatory Visit (HOSPITAL_COMMUNITY)
Admission: RE | Admit: 2014-08-04 | Discharge: 2014-08-04 | Disposition: A | Discharge status: Home / Self Care | Payer: Medicare Other | Source: Other Acute Inpatient Hospital | Attending: Emergency Medicine | Admitting: Emergency Medicine

## 2014-08-04 ENCOUNTER — Encounter (HOSPITAL_COMMUNITY): Payer: Self-pay

## 2014-08-04 ENCOUNTER — Ambulatory Visit (HOSPITAL_COMMUNITY): Payer: Medicare Other

## 2014-08-04 ENCOUNTER — Ambulatory Visit (HOSPITAL_COMMUNITY): Admit: 2014-08-04 | Payer: Self-pay | Admitting: Emergency Medicine

## 2014-08-04 ENCOUNTER — Encounter (HOSPITAL_COMMUNITY): Admission: RE | Disposition: A | Discharge status: Home / Self Care | Payer: Self-pay | Attending: Emergency Medicine

## 2014-08-04 ENCOUNTER — Ambulatory Visit (HOSPITAL_COMMUNITY)
Admission: RE | Admit: 2014-08-04 | Discharge: 2014-08-04 | Disposition: A | Discharge status: Home / Self Care | Payer: Medicare Other | Source: Ambulatory Visit | Attending: Emergency Medicine | Admitting: Emergency Medicine

## 2014-08-04 DIAGNOSIS — J90 Pleural effusion, not elsewhere classified: Secondary | ICD-10-CM | POA: Insufficient documentation

## 2014-08-04 DIAGNOSIS — E669 Obesity, unspecified: Secondary | ICD-10-CM | POA: Diagnosis not present

## 2014-08-04 DIAGNOSIS — Z9889 Other specified postprocedural states: Secondary | ICD-10-CM

## 2014-08-04 DIAGNOSIS — R0989 Other specified symptoms and signs involving the circulatory and respiratory systems: Secondary | ICD-10-CM | POA: Diagnosis not present

## 2014-08-04 DIAGNOSIS — R918 Other nonspecific abnormal finding of lung field: Secondary | ICD-10-CM | POA: Diagnosis not present

## 2014-08-04 HISTORY — PX: VIDEO BRONCHOSCOPY: SHX5072

## 2014-08-04 LAB — OTHER BODY FLUID CHEMISTRY

## 2014-08-04 SURGERY — BRONCHOSCOPY, WITH FLUOROSCOPY
Anesthesia: Moderate Sedation | Laterality: Bilateral

## 2014-08-04 MED ORDER — MIDAZOLAM HCL 5 MG/ML IJ SOLN
INTRAMUSCULAR | Status: AC
  Filled 2014-08-04: qty 2

## 2014-08-04 MED ORDER — LIDOCAINE HCL 2 % EX GEL: 1.0000 "application " | Freq: Once | CUTANEOUS | Status: DC

## 2014-08-04 MED ORDER — FENTANYL CITRATE (PF) 100 MCG/2ML IJ SOLN
INTRAMUSCULAR | Status: AC
  Filled 2014-08-04: qty 4

## 2014-08-04 MED ORDER — MIDAZOLAM HCL 10 MG/2ML IJ SOLN
INTRAMUSCULAR | Status: DC | PRN
  Administered 2014-08-04 (×2): 3 mg via INTRAVENOUS
  Administered 2014-08-04 (×3): 1 mg via INTRAVENOUS

## 2014-08-04 MED ORDER — SODIUM CHLORIDE 0.9 % IV SOLN: INTRAVENOUS | Status: DC

## 2014-08-04 MED ORDER — PHENYLEPHRINE HCL 0.25 % NA SOLN
NASAL | Status: DC | PRN
  Administered 2014-08-04: 2 via NASAL

## 2014-08-04 MED ORDER — LIDOCAINE HCL 1 % IJ SOLN
INTRAMUSCULAR | Status: DC | PRN
  Administered 2014-08-04: 6 mL via RESPIRATORY_TRACT

## 2014-08-04 MED ORDER — PHENYLEPHRINE HCL 0.25 % NA SOLN
1.0000 | Freq: Four times a day (QID) | NASAL | Status: DC | PRN
  Filled 2014-08-04: qty 15

## 2014-08-04 MED ORDER — FENTANYL CITRATE (PF) 100 MCG/2ML IJ SOLN
INTRAMUSCULAR | Status: DC | PRN
  Administered 2014-08-04 (×2): 25 ug via INTRAVENOUS
  Administered 2014-08-04: 50 ug via INTRAVENOUS

## 2014-08-04 MED ORDER — LIDOCAINE HCL 2 % EX GEL
CUTANEOUS | Status: DC | PRN
  Administered 2014-08-04: 1

## 2014-08-04 NOTE — Interval H&P Note (Signed)
PCCM Interval Note  Pt denies any change in cough. He still has some exertional SOB but this is better than before his recent thoracentesis. No new problems to report.   Filed Vitals:   08/04/14 1040 08/04/14 1045 08/04/14 1050 08/04/14 1055  BP:      Pulse:   68 67  Temp:      TempSrc:      Resp: '20 22 23 22  '$ SpO2:   93% 94%  Gen: Pleasant, obese, in no distress,  normal affect  ENT: No lesions,  mouth clear,  oropharynx clear, no postnasal drip  Lungs: Decreased on R, clear without rales or rhonchi  Cardiovascular: RRR, heart sounds normal, no murmur or gallops, trace peripheral edema  Abdomen: protuberant soft and NT, no HSM,  BS normal  Neuro: alert, non focal   Recent Labs Lab 07/30/14 1204  HGB 10.2*  HCT 32.4*  WBC 10.5  PLT 251    Recent Labs Lab 07/31/14 1006  INR 1.37    Recent Labs Lab 07/30/14 1204 07/31/14 0355 08/01/14 0403  NA 141 138 137  K 5.0 5.2* 4.4  CL 108 105 103  CO2 '27 25 26  '$ GLUCOSE 188* 129* 128*  BUN 39* 38* 35*  CREATININE 1.55* 1.37* 1.40*  CALCIUM 10.6* 10.3 9.7   Plans:  Will proceed with FOB to examine LLL airway, attempt LLL bx's   Baltazar Apo, MD, PhD 08/04/2014, 11:14 AM [facility] [phone] or if no answer [phone]

## 2014-08-04 NOTE — Op Note (Signed)
Video Bronchoscopy Procedure Note  Date of Operation: 08/04/2014  Pre-op Diagnosis: LLL collapse and possible LLL mass  Post-op Diagnosis: Same  Surgeon: Baltazar Apo  Assistants: none  Anesthesia: conscious sedation, moderate sedation  Meds Given: fentanyl 159mg, versed '9mg'$  in divided doses, 1% lidocaine 20cc total  Operation: Flexible video fiberoptic bronchoscopy and biopsies.  Estimated Blood Loss: 170-14DC Complications: none noted  Indications and History: John BROCKis 80y.o. with history of L effusion and LLL collapse, possible LLL mass.  Recommendation was to perform video fiberoptic bronchoscopy with possible biopsies. The risks, benefits, complications, treatment options and expected outcomes were discussed with the patient.  The possibilities of pneumothorax, pneumonia, reaction to medication, pulmonary aspiration, perforation of a viscus, bleeding, failure to diagnose a condition and creating a complication requiring transfusion or operation were discussed with the patient who freely signed the consent.    Description of Procedure: The patient was seen in the Preoperative Area, was examined and was deemed appropriate to proceed.  The patient was taken to [facility], identified as John Sheffieldand the procedure verified as Flexible Video Fiberoptic Bronchoscopy.  A Time Out was held and the above information confirmed.   Conscious sedation was initiated as indicated above. There was some difficulty getting him adequately sedated while maintaining good SpO2. For a portion of the case we tolerated SpO2 88-89%. The video fiberoptic bronchoscope was introduced via the L nare and a general inspection was performed which showed normal cords, normal trachea, normal main carina. The R sided airways were inspected and showed normal RUL, BI, RML and RLL. The L side was then inspected. The LUL carina was somewhat thickened. The LUL and lingular airways were grossly normal with  some further thickening of the mucous extending into the LUL. All of the LLL airways were significantly narrowed just distal to the LLL carina such that the scope could not pass. Even so, no endobronchial lesion was seen. Under fluoroscopic guidance transbronchial brushings and transbronchial needle aspiration biopsies were take from the LLL to be sent for cytology. Then using a separate needle, transbronchial Wang needle biopsies were obtained from the subcarinal area to sample the Station 7 node.   The patient coughed throughout, but otherwise tolerated the procedure well. The bronchoscope was removed. There were no obvious complications. A CXR is ordered and pending.   Samples: 1. Transbronchial brushings from LLL 2. Transbronchial Wang needle biopsies from LLL 3. Transbronchial Wang needle biopsies from Station 7 node  Plans:  We will review the cytology results with the patient when they become available.  Outpatient followup will be with Dr BLamonte Sakai    RBaltazar Apo MD, PhD 08/04/2014, 12:13 PM [facility] [phone] if no answer [phone]

## 2014-08-04 NOTE — H&P (View-Only) (Signed)
Name: John Potter MRN: [id number] DOB: [birth year]    ADMISSION DATE:  07/30/2014 CONSULTATION DATE:  07/31/14  REFERRING MD :  Dr. Candiss Norse   CHIEF COMPLAINT:  SOB, Abnormal CXR   BRIEF PATIENT DESCRIPTION: 80 y/o M admitted 7/13 with a 6 month history of mild SOB, progressive over past 1 month PTA, lower extremity swelling and fatigue.  Work up notable for left pleural effusion and PCCM consulted for evaluation.   SIGNIFICANT EVENTS  7/13  Admit with SOB, CXR with L pleural effusion 7/14  L thoracentesis >> 1.2L rust colored fluid removed   STUDIES:  6/17  ECHO >> LVEF 55-60%, mild AS, mild LA dilation, no PFO  SUBJECTIVE / Interval Hx:  CT chest reviewed by Dr Lamonte Sakai personally  No complaints  VITAL SIGNS: Temp:  [97.4 F (36.3 C)-98.8 F (37.1 C)] 98.3 F (36.8 C) (07/15 0446) Pulse Rate:  [64-80] 64 (07/15 0446) Resp:  [18] 18 (07/15 0446) BP: (126-157)/(48-70) 157/67 mmHg (07/15 0446) SpO2:  [94 %-97 %] 97 % (07/15 0446) Weight:  [217 lb 6.4 oz (98.612 kg)] 217 lb 6.4 oz (98.612 kg) (07/15 0446)  PHYSICAL EXAMINATION: General:  wdwn adult male in NAD, non-toxic appearing  Neuro:  AAOx4, speech clear, MAE HEENT:  MM pink/moist, no jvd, no bruits Cardiovascular:  s1s2 rrr, 1/6 SEM without radiation to carotids Lungs:  resp's even/non-labored, clear on R, bronchial breath sounds on left lower posterior, dullness to percussion  Abdomen:  Obese/soft, NTND, bsx4 active  Musculoskeletal:  No acute deformities  Skin:  Warm/dry, 1 edema    Recent Labs Lab 07/30/14 1204 07/31/14 0355 08/01/14 0403  NA 141 138 137  K 5.0 5.2* 4.4  CL 108 105 103  CO2 '27 25 26  '$ BUN 39* 38* 35*  CREATININE 1.55* 1.37* 1.40*  GLUCOSE 188* 129* 128*    Recent Labs Lab 07/30/14 1204  HGB 10.2*  HCT 32.4*  WBC 10.5  PLT 251   Dg Chest 2 View  07/30/2014   CLINICAL DATA:  Shortness of breath.  EXAM: CHEST  2 VIEW  COMPARISON:  Report from 07/04/2002  FINDINGS: Large left pleural  effusion occupying 75% of left hemithoracic volume. Multiple old left rib deformities.  Prior CABG. Atherosclerotic aortic arch. Suspected enlargement of the cardiopericardial silhouette (left heart border obscured).  The right lung appears clear.  Thoracic spondylosis noted.  IMPRESSION: 1. Large left pleural effusion with passive atelectasis. The effusion occupies about 75% of left hemithoracic volume. 2. Suspected enlargement of the cardiopericardial silhouette but no overt edema. Prior CABG. 3. Atherosclerotic aortic arch.   Electronically Signed   By: Van Clines M.D.   On: 07/30/2014 12:47   Ct Chest Wo Contrast  07/31/2014   CLINICAL DATA:  Evaluate pleural effusion  EXAM: CT CHEST WITHOUT CONTRAST  TECHNIQUE: Multidetector CT imaging of the chest was performed following the standard protocol without IV contrast.  COMPARISON:  Chest radiograph dated 07/31/2014  FINDINGS: Mediastinum/Nodes: Heart is normal in size. No pericardial effusion.  Coronary atherosclerosis. Postsurgical changes related to prior CABG.  Atherosclerotic calcifications of the aortic arch.  Thoracic lymphadenopathy, including:  --14 mm short axis node at the left thoracic inlet (series 21/image 13)  --11 mm short axis AP window node (series 21/image 23)  --13 mm short axis prevascular node (series 21/image 25)  --Abnormal soft tissue in the left hilar region (series 21/image 28)  --17 mm short axis subcarinal node (series 21/image 31)  2.3 cm left inferior  thyroid nodule (series 21/image 13).  Lungs/Pleura: 11.3 x 8.8 cm left lower lobe mass (series 21/ image 42) extending to the left infrahilar/ perihilar region.  Associated small to moderate loculated left pleural effusion, likely malignant.  Mild branching nodularity in the posterior right upper lobe (series 203/images 24 and 25), suspicious for endobronchial spread of infection or tumor.  No pneumothorax status post thoracentesis.  Upper abdomen: Visualized upper abdomen is  notable for two nodes at the GE junction measuring up to 14 mm short axis (series 201/ image 49), cholelithiasis, and surgical clips in the left upper abdomen.  Musculoskeletal: Degenerative changes of the visualized thoracolumbar spine.  IMPRESSION: 11.3 x 8.8 cm left lower lobe mass extending to the left infrahilar/ perihilar region, compatible with primary bronchogenic neoplasm.  Associated small to moderate loculated left pleural effusion, likely malignant.  Mild branching nodularity in the posterior right upper lobe, suspicious for endobronchial spread of infection or tumor.  Thoracic and upper abdominal nodal metastases, as above.  No pneumothorax.   Electronically Signed   By: Julian Hy M.D.   On: 07/31/2014 17:48   Dg Chest Port 1 View  07/31/2014   CLINICAL DATA:  Status post left thoracentesis. Patient symptomatically improved.  EXAM: PORTABLE CHEST - 1 VIEW  COMPARISON:  07/30/2014.  FINDINGS: There is decreased opacity throughout the mid and lower lung consistent with significant decreased pleural fluid. Residual opacity may reflect pneumonia or inflammation, atelectasis or a combination.  There is no pneumothorax. Right lung remains clear. Stable left upper lobe mass like opacity.  IMPRESSION: 1. Significant decrease in pleural fluid on the left following thoracentesis. No pneumothorax.   Electronically Signed   By: Lajean Manes M.D.   On: 07/31/2014 14:27    ASSESSMENT / PLAN:  Left Pleural Effusion and complicated pleural space - large left exudative effusion. Areas of residual loculation, LLL consolidation +/- mass on Ct chest 7/14. Cytology reviewed 7/15 > no malignant cells seen in pleural fluid.  Probable LLL mass  Plan: Several options for next diagnostic steps including FOB to assess LLL, but I would recommend at least reviewing films with TCTS to see if VATS would allow clean-out pleural material and also a dx. We can arrange for FOB >> 11:00am Monday 08/04/14   Noe Gens, NP-C [facility] Pgr: [phone] or if no answer [phone] 08/01/2014, 10:08 AM   Attending Note:  I have examined patient, reviewed labs, studies and notes. I have discussed the case with B Ollis, and I agree with the data and plans as amended above.   Baltazar Apo, MD, PhD 08/01/2014, 12:21 PM [facility] [phone] or if no answer [phone]

## 2014-08-04 NOTE — Progress Notes (Signed)
Video Bronchoscopy Done  Intervention Bronchial Brushing done Intervention Bronchial Needle Aspiration X 2 done

## 2014-08-04 NOTE — Discharge Instructions (Signed)
Flexible Bronchoscopy, Care After These instructions give you information on caring for yourself after your procedure. Your doctor may also give you more specific instructions. Call your doctor if you have any problems or questions after your procedure. HOME CARE  Do not eat or drink anything for 2 hours after your procedure. If you try to eat or drink before the medicine wears off, food or drink could go into your lungs. You could also burn yourself.  After 2 hours have passed and when you can cough and gag normally, you may eat soft food and drink liquids slowly.  The day after the test, you may eat your normal diet.  You may do your normal activities.  Keep all doctor visits. GET HELP RIGHT AWAY IF:  You get more and more short of breath.  You get light-headed.  You feel like you are going to pass out (faint).  You have chest pain.  You have new problems that worry you.  You cough up more than a little blood.  You cough up more blood than before. MAKE SURE YOU:  Understand these instructions.  Will watch your condition.  Will get help right away if you are not doing well or get worse. Document Released: 10/31/2008 Document Revised: 01/08/2013 Document Reviewed: 09/07/2012 [facility] Patient Information 2015 [place], [place]. This information is not intended to replace advice given to you by your health care provider. Make sure you discuss any questions you have with your health care provider.  NOTHING TO EAT OR DRINK UNTIL   2:00 pm   TODAY    08/04/2014  Please call our office for any questions or concerns. [phone].

## 2014-08-06 ENCOUNTER — Telehealth: Payer: Self-pay | Admitting: Pulmonary Disease

## 2014-08-06 DIAGNOSIS — R918 Other nonspecific abnormal finding of lung field: Secondary | ICD-10-CM

## 2014-08-06 NOTE — Telephone Encounter (Signed)
PET has been scheduled for 08/14/2014 at [place] has been scheduled for 08/14/2014 at 1:30pm. Spoke with pt's daughter, [name]. She is aware of both appointments. Nothing further was needed.

## 2014-08-06 NOTE — Telephone Encounter (Signed)
Order was placed for the PET scan. I have sent a message to the [facility] to ask them to let me know when it's scheduled so I can make the ROV.

## 2014-08-06 NOTE — Telephone Encounter (Signed)
Spoke with the patient and to his grandson who was with him. Reviewed cytology >> shows highly atypical cells but not quantitatively enough to make a call of malignancy. I have recommended to him that we perform a PET scan. It is possible that the atypical cells + PET results would be enough for [place] to consider treating. Alternatively PET will help me make a recommendation about repeat bx location.   I also called his daughter [name], but was unable to get her. Left a message. I will try her again  Please order a PET scan for ASAP, and then an OV with Dazha Kempa soon after to review. OK to overbook schedule for this.

## 2014-08-06 NOTE — Telephone Encounter (Signed)
Pt's daughter [name] called me back. I explained the results and the plans. She knows to wait for the PET and OV to be scheduled.

## 2014-08-07 ENCOUNTER — Encounter (HOSPITAL_COMMUNITY): Payer: Self-pay | Admitting: Emergency Medicine

## 2014-08-14 ENCOUNTER — Encounter: Payer: Self-pay | Admitting: Emergency Medicine

## 2014-08-14 ENCOUNTER — Encounter (HOSPITAL_COMMUNITY)
Admission: RE | Admit: 2014-08-14 | Discharge: 2014-08-14 | Disposition: A | Discharge status: Home / Self Care | Payer: Medicare Other | Source: Ambulatory Visit | Attending: Emergency Medicine | Admitting: Emergency Medicine

## 2014-08-14 ENCOUNTER — Ambulatory Visit (INDEPENDENT_AMBULATORY_CARE_PROVIDER_SITE_OTHER): Payer: Medicare Other | Admitting: Emergency Medicine

## 2014-08-14 VITALS — BP 120/64 | HR 68 | Ht 68.0 in | Wt 217.0 lb

## 2014-08-14 DIAGNOSIS — R918 Other nonspecific abnormal finding of lung field: Secondary | ICD-10-CM | POA: Insufficient documentation

## 2014-08-14 DIAGNOSIS — R630 Anorexia: Secondary | ICD-10-CM | POA: Diagnosis not present

## 2014-08-14 DIAGNOSIS — E041 Nontoxic single thyroid nodule: Secondary | ICD-10-CM | POA: Diagnosis not present

## 2014-08-14 DIAGNOSIS — R911 Solitary pulmonary nodule: Secondary | ICD-10-CM | POA: Diagnosis not present

## 2014-08-14 DIAGNOSIS — R590 Localized enlarged lymph nodes: Secondary | ICD-10-CM | POA: Diagnosis not present

## 2014-08-14 LAB — GLUCOSE, CAPILLARY: GLUCOSE-CAPILLARY: 129 mg/dL — AB (ref 65–99)

## 2014-08-14 MED ORDER — HYDROCORTISONE 2.5 % EX CREA: TOPICAL_CREAM | Freq: Two times a day (BID) | CUTANEOUS | Status: AC

## 2014-08-14 MED ORDER — TECHNETIUM TC 99M SESTAMIBI - CARDIOLITE: 10.8200 | Freq: Once | INTRAVENOUS | Status: DC | PRN

## 2014-08-14 MED ORDER — FLUDEOXYGLUCOSE F - 18 (FDG) INJECTION
10.8000 | Freq: Once | INTRAVENOUS | Status: AC | PRN
  Administered 2014-08-14: 10.8 via INTRAVENOUS

## 2014-08-14 NOTE — Progress Notes (Signed)
HISTORY OF PRESENT ILLNESS: 80 y/o M, former smoker (16 years, 1ppd, quit 1975) with PMH of HTN, HLD, DM II, gout, rheumatic fever, systolic murmur, BPH, renal carcinoma s/p L nephrectomy, CAD s/p CABG in 1991 and hyperglycemia who presented to [facility] on 7/14 with a 6 month history of shortness of breath.   The patient reported he has had mild SOB for 6 months with increased lower extremity swelling. Specifically, over the past month he has had daily progression of increasing SOB. He denies fevers, chills, chest pain, sputum production, hemoptysis, syncope. He notes progressive fatigue and decreased activity tolerance. Patient denies weight loss or change in appetite. He denies falls, taking anticoagulation or chest trauma.   He is widowed, lives alone and is independent with ADL's. He continues to drive. 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 Served in [employer] but no known exposures from work or [employer] history.   Initial outpatient OV 08/14/14 -- former smoker, patient follows after bronchoscopy to biopsy a large left lower lobe mass/area of atelectasis. Cytology revealed atypical cells but not enough to call malignancy. A PET scan was performed on 08/14/14 that I have reviewed personally. This shows significant hypermetabolism of the left lower lobe, also noted was hypermetabolic mediastinal lymphadenopathy. He is here today with his daughter and history is taken from both the patient and from her.  He continues to have weakness. He also notes some significant decrease in appetite such that he is beginning to have some weight loss. He denies any hemoptysis or pain in his chest    Past Medical History  Diagnosis Date  . CAD (coronary artery disease)     sees Dr. Dani Gobble Croitoru   . Hypertension   . Hyperlipidemia   . Rheumatic fever "I was an infant"  . Hyperglycemia   . BPH (benign prostatic hyperplasia)     sees Dr. Junious Silk   . Heart murmur dx'd 1958  .  Type II diabetes mellitus dx'd ~ 04/2014    "mild; advised by MD to lose weight; no RX" (07/30/2014)  . History of gout   . Kidney carcinoma 1999    sees Dr. Junious Silk      Family History  Problem Relation Age of Onset  . Heart disease Father   . Hyperlipidemia Father   . Hypertension Father   . Diabetes Father      History   Social History  . Marital Status: Widowed    Spouse Name: N/A  . Number of Children: N/A  . Years of Education: N/A   Occupational History  . Not on file.   Social History Main Topics  . Smoking status: Former Smoker -- 1.00 packs/day for 16 years    Types: Cigarettes    Quit date: 07/17/1973  . Smokeless tobacco: Never Used  . Alcohol Use: No  . Drug Use: No  . Sexual Activity: Not on file   Other Topics Concern  . Not on file   Social History Narrative     Allergies  Allergen Reactions  . Amoxicillin     rash     Outpatient Prescriptions Prior to Visit  Medication Sig Dispense Refill  . aspirin 81 MG tablet Take 81 mg by mouth every evening.     . calcium carbonate (OS-CAL) 600 MG TABS Take 600 mg by mouth daily. Vit D 3     . ferrous sulfate 325 (65 FE) MG tablet TAKE 1 TABLET BY MOUTH ONCE DAILY WITH BREAKFAST  30 tablet 11  . fish oil-omega-3 fatty acids 1000 MG capsule Take 2 g by mouth daily.      . furosemide (LASIX) 40 MG tablet Take 1 tablet (40 mg total) by mouth 3 (three) times a week. 90 tablet 3  . lisinopril (PRINIVIL,ZESTRIL) 5 MG tablet Take 1 tablet (5 mg total) by mouth daily. 30 tablet 0  . metoprolol tartrate (LOPRESSOR) 25 MG tablet Take 25 mg by mouth 2 (two) times daily.      . Multiple Vitamin (MULTIVITAMIN) tablet Take 1 tablet by mouth daily.      . Multiple Vitamins-Minerals (PRESERVISION/LUTEIN) CAPS Take by mouth daily. Take 2 capsules every day    . simvastatin (ZOCOR) 20 MG tablet Take 20 mg by mouth at bedtime.      . Tamsulosin HCl (FLOMAX) 0.4 MG CAPS Take 0.4 mg by mouth daily.    . vitamin C (ASCORBIC  ACID) 500 MG tablet Take 1,000 mg by mouth daily.      No facility-administered medications prior to visit.    Filed Vitals:   08/14/14 1342  BP: 120/64  Pulse: 68  Height: '5\' 8"'$  (1.727 m)  Weight: 217 lb (98.431 kg)  SpO2: 91%   Gen: Pleasant, overwt, in no distress,  normal affect, in wheelchair  ENT: No lesions,  mouth clear,  oropharynx clear, no postnasal drip  Lungs: No use of accessory muscles, very little air movement at the left base, clear on the right  Cardiovascular: RRR, heart sounds normal, no murmur or gallops, trace lower extremity peripheral edema  Musculoskeletal: No deformities, no cyanosis or clubbing  Neuro: alert, non focal  Skin: Warm, no lesions or rashes   PET scan 08/14/14 --  COMPARISON: Chest CT 07/31/2014.  FINDINGS: NECK  No hypermetabolic lymph nodes in the neck.  CHEST  Large infiltrative hypermetabolic (SUVmax = 62.3-76.2) mass centered in the left lower lobe measuring at least 9.8 x 11.8 cm. There is a nodular thickening of the peribronchovascular and subpleural interstitium in the left upper lobe with multifocal hyper metabolism, compatible with lymphangitic spread of tumor. Moderate partially loculated pleural effusion with multifocal pleural hypermetabolism and nodularity, compatible with a malignant pleural effusion. Innumerable enlarged and hypermetabolic left hilar and mediastinal lymph nodes, including the largest lymph node in the subcarinal nodal station which measures up to 18 mm in short axis (SUVmax = 14.0). One of these hypermetabolic lymph nodes (SUVmax = 5.5)is a low right paratracheal lymph node measuring 11 mm (image 75 of series 4). No right hilar lymphadenopathy. In addition, there is lymphadenopathy in the superior mediastinum, with a 14 mm short axis lymph node adjacent to the proximal left subclavian artery (SUVmax = 16.5). 3.2 x 1.3 cm hypermetabolic (SUVmax = 8.3)TDVVOH in the right lung is favored to  represent an infected bronchocele, and is less likely to represent a metastasis or second primary lesion. Small right pleural effusion lying dependently. Heart size is normal. There is no significant pericardial fluid, thickening or pericardial calcification. There is atherosclerosis of the thoracic aorta, the great vessels of the mediastinum and the coronary arteries, including calcified atherosclerotic plaque in the left main, left anterior descending, left circumflex and right coronary arteries. Status post median sternotomy for CABG, including LIMA to the LAD. Severe calcifications of the aortic valve. 1.9 cm hypermetabolic (SUVmax = 6.0)VPXT thyroid nodule.  ABDOMEN/PELVIS  No abnormal hypermetabolic activity within the liver, pancreas, adrenal glands, or spleen. 11 mm soft tissue attenuation nodule (image 159 of series 4)  in the left lower quadrant of the abdomen is hypermetabolic (SUVmax = 7.0), concerning for a metastatic peritoneal implant. Calcified gallstones lying dependently in the gallbladder. No current findings to suggest an acute cholecystitis at this time. Postoperative changes of left-sided radical nephrectomy. Multiple low-attenuation lesions in the region of the right renal pelvis, presumably parapelvic cysts.  SKELETON  Multifocal sites of skeletal hypermetabolism throughout the visualized axial and appendicular skeleton, compatible with widespread skeletal metastasis. The largest site of hyper metabolism is a very ill-defined lucent lesion in the left side of the T11 vertebral body (SUVmax = 8.8).  IMPRESSION: 1. Findings, as above, compatible with stage IV (T4, N3, M1b) lung cancer. Specifically, there is a large mass with epicenter in the left lower lobe, lymphangitic spread of tumor in the left upper lobe, malignant left pleural effusion, left hilar and bilateral mediastinal lymphadenopathy, as well as metastatic disease to the peritoneal and  visualized axial and appendicular skeleton, as detailed above. 2. Hypermetabolic 1.9 cm left thyroid nodule. This could represent a metastasis or primary thyroid neoplasm. 3. 3.2 x 1.3 cm elongated hypermetabolic lesion in the right upper lobe is favored to represent an infected bronchocele, and is less likely to represent a metastatic lesion. 4. Additional incidental findings, as above.   Lung mass His PET scan confirms a hypermetabolic left lower lobe mass. Unfortunately my efforts to obtain cytology and pathology showed only atypical cells without any official tissue diagnosis. I explained to him today that therapy options would be more clear if we did have a tissue diagnosis.. I will refer him to multi disciplinary thoracic oncology clinic but will also send him to interventional radiology to consider targeted transthoracic needle biopsy into the area of hypermetabolism in his left lower lobe.   Anorexia This is a new and evolving problem that is almost certainly due to his lung malignancy. Discussed with his daughter today efforts to increase his calorie intake. She will give him boost supplements. Also I encouraged him to eat anything that he thought would taste good, even snack foods. Ultimately I believe we need to begin treatment for his lung cancer before his appetite will improve

## 2014-08-14 NOTE — Patient Instructions (Signed)
We will refer you to interventional radiology to have a repeat left lung biopsy We will refer you to thoracic oncology clinic Please use hydrocortisone cream 2.5% 3 times a day to the affected skin on the left side of your abdomen Follow with Dr Lamonte Sakai in 1 month

## 2014-08-15 DIAGNOSIS — R630 Anorexia: Secondary | ICD-10-CM | POA: Insufficient documentation

## 2014-08-15 NOTE — Assessment & Plan Note (Signed)
This is a new and evolving problem that is almost certainly due to his lung malignancy. Discussed with his daughter today efforts to increase his calorie intake. She will give him boost supplements. Also I encouraged him to eat anything that he thought would taste good, even snack foods. Ultimately I believe we need to begin treatment for his lung cancer before his appetite will improve

## 2014-08-15 NOTE — Assessment & Plan Note (Signed)
His PET scan confirms a hypermetabolic left lower lobe mass. Unfortunately my efforts to obtain cytology and pathology showed only atypical cells without any official tissue diagnosis. I explained to him today that therapy options would be more clear if we did have a tissue diagnosis.. I will refer him to multi disciplinary thoracic oncology clinic but will also send him to interventional radiology to consider targeted transthoracic needle biopsy into the area of hypermetabolism in his left lower lobe.

## 2014-08-18 ENCOUNTER — Other Ambulatory Visit: Payer: Self-pay | Admitting: Radiology

## 2014-08-18 ENCOUNTER — Telehealth: Payer: Self-pay | Admitting: *Deleted

## 2014-08-18 ENCOUNTER — Other Ambulatory Visit: Payer: Self-pay | Admitting: *Deleted

## 2014-08-18 DIAGNOSIS — R918 Other nonspecific abnormal finding of lung field: Secondary | ICD-10-CM

## 2014-08-18 NOTE — Telephone Encounter (Signed)
Oncology Nurse Navigator Documentation  Oncology Nurse Navigator Flowsheets 08/18/2014  Referral date to RadOnc/MedOnc 08/18/2014  Navigator Encounter Type Introductory phone call/I received a referral on John Potter today.  I called to set up an appt for [name] on 08/21/14 arrive at cancer center at 12:30.  Patient verbalized understanding of appt time and place.   Treatment Phase Abnormal Scans  Barriers/Navigation Needs Education  Interventions Coordination of Care  Coordination of Care MD Appointments  Time Spent with Patient 15

## 2014-08-19 ENCOUNTER — Encounter (HOSPITAL_COMMUNITY): Payer: Self-pay

## 2014-08-19 ENCOUNTER — Other Ambulatory Visit: Payer: Medicare Other

## 2014-08-19 ENCOUNTER — Ambulatory Visit (HOSPITAL_COMMUNITY)
Admission: RE | Admit: 2014-08-19 | Discharge: 2014-08-19 | Disposition: A | Discharge status: Home / Self Care | Payer: Medicare Other | Source: Ambulatory Visit | Attending: Interventional Radiology | Admitting: Interventional Radiology

## 2014-08-19 ENCOUNTER — Ambulatory Visit (HOSPITAL_COMMUNITY)
Admission: RE | Admit: 2014-08-19 | Discharge: 2014-08-19 | Disposition: A | Discharge status: Home / Self Care | Payer: Medicare Other | Source: Ambulatory Visit | Attending: Emergency Medicine | Admitting: Emergency Medicine

## 2014-08-19 DIAGNOSIS — I2510 Atherosclerotic heart disease of native coronary artery without angina pectoris: Secondary | ICD-10-CM | POA: Insufficient documentation

## 2014-08-19 DIAGNOSIS — Z79899 Other long term (current) drug therapy: Secondary | ICD-10-CM | POA: Insufficient documentation

## 2014-08-19 DIAGNOSIS — C3432 Malignant neoplasm of lower lobe, left bronchus or lung: Secondary | ICD-10-CM | POA: Insufficient documentation

## 2014-08-19 DIAGNOSIS — Z905 Acquired absence of kidney: Secondary | ICD-10-CM | POA: Insufficient documentation

## 2014-08-19 DIAGNOSIS — Z85528 Personal history of other malignant neoplasm of kidney: Secondary | ICD-10-CM | POA: Insufficient documentation

## 2014-08-19 DIAGNOSIS — N400 Enlarged prostate without lower urinary tract symptoms: Secondary | ICD-10-CM | POA: Insufficient documentation

## 2014-08-19 DIAGNOSIS — Z951 Presence of aortocoronary bypass graft: Secondary | ICD-10-CM | POA: Diagnosis not present

## 2014-08-19 DIAGNOSIS — Z87891 Personal history of nicotine dependence: Secondary | ICD-10-CM | POA: Insufficient documentation

## 2014-08-19 DIAGNOSIS — R918 Other nonspecific abnormal finding of lung field: Secondary | ICD-10-CM | POA: Diagnosis not present

## 2014-08-19 DIAGNOSIS — Z7982 Long term (current) use of aspirin: Secondary | ICD-10-CM | POA: Diagnosis not present

## 2014-08-19 DIAGNOSIS — J181 Lobar pneumonia, unspecified organism: Secondary | ICD-10-CM | POA: Diagnosis not present

## 2014-08-19 LAB — CBC
HCT: 30.6 % — ABNORMAL LOW (ref 39.0–52.0)
Hemoglobin: 9.4 g/dL — ABNORMAL LOW (ref 13.0–17.0)
MCH: 26.3 pg (ref 26.0–34.0)
MCHC: 30.7 g/dL (ref 30.0–36.0)
MCV: 85.5 fL (ref 78.0–100.0)
PLATELETS: 306 10*3/uL (ref 150–400)
RBC: 3.58 MIL/uL — ABNORMAL LOW (ref 4.22–5.81)
RDW: 15.7 % — ABNORMAL HIGH (ref 11.5–15.5)
WBC: 13.5 10*3/uL — ABNORMAL HIGH (ref 4.0–10.5)

## 2014-08-19 LAB — PROTIME-INR
INR: 1.49 (ref 0.00–1.49)
PROTHROMBIN TIME: 18.1 s — AB (ref 11.6–15.2)

## 2014-08-19 LAB — APTT: aPTT: 32 seconds (ref 24–37)

## 2014-08-19 MED ORDER — SODIUM CHLORIDE 0.9 % IV SOLN
INTRAVENOUS | Status: DC
  Administered 2014-08-19: 10:00:00 via INTRAVENOUS

## 2014-08-19 MED ORDER — MIDAZOLAM HCL 2 MG/2ML IJ SOLN
INTRAMUSCULAR | Status: AC | PRN
  Administered 2014-08-19: 0.5 mg via INTRAVENOUS

## 2014-08-19 MED ORDER — FENTANYL CITRATE (PF) 100 MCG/2ML IJ SOLN
INTRAMUSCULAR | Status: AC
  Filled 2014-08-19: qty 2

## 2014-08-19 MED ORDER — LIDOCAINE HCL 1 % IJ SOLN
INTRAMUSCULAR | Status: AC
  Filled 2014-08-19: qty 20

## 2014-08-19 MED ORDER — MIDAZOLAM HCL 2 MG/2ML IJ SOLN
INTRAMUSCULAR | Status: AC
  Filled 2014-08-19: qty 2

## 2014-08-19 MED ORDER — FENTANYL CITRATE (PF) 100 MCG/2ML IJ SOLN
INTRAMUSCULAR | Status: AC | PRN
  Administered 2014-08-19: 25 ug via INTRAVENOUS

## 2014-08-19 NOTE — H&P (Signed)
HPI: Patient with hypermetabolic LLL lung mass s/p bronchoscopy with atypical cells-no malignancy. He has been seen by Dr. Lamonte Sakai and scheduled today for image guided LLL lung mass biopsy.  The patient has had a H&P performed within the last 30 days, all history, medications, and exam have been reviewed. The patient denies any interval changes since the H&P.  Medications: Prior to Admission medications   Medication Sig Start Date End Date Taking? Authorizing Provider  aspirin EC 81 MG tablet Take 81 mg by mouth every evening.   Yes Historical Provider, MD  Calcium Carb-Cholecalciferol (CALCIUM 600 + D) 600-200 MG-UNIT TABS Take 1 tablet by mouth daily.   Yes Historical Provider, MD  ferrous sulfate 325 (65 FE) MG tablet TAKE 1 TABLET BY MOUTH ONCE DAILY WITH BREAKFAST 01/23/14  Yes Laurey Morale, MD  fish oil-omega-3 fatty acids 1000 MG capsule Take 2 g by mouth daily.     Yes Historical Provider, MD  furosemide (LASIX) 40 MG tablet Take 1 tablet (40 mg total) by mouth 3 (three) times a week. 07/23/14  Yes Mihai Croitoru, MD  hydrocortisone 2.5 % cream Apply topically 2 (two) times daily. 08/14/14  Yes Collene Gobble, MD  lisinopril (PRINIVIL,ZESTRIL) 5 MG tablet Take 1 tablet (5 mg total) by mouth daily. 08/01/14  Yes Thurnell Lose, MD  metoprolol tartrate (LOPRESSOR) 25 MG tablet Take 25 mg by mouth 2 (two) times daily.     Yes Historical Provider, MD  Multiple Vitamin (MULTIVITAMIN) tablet Take 1 tablet by mouth daily.     Yes Historical Provider, MD  Multiple Vitamins-Minerals (PRESERVISION/LUTEIN) CAPS Take 2 capsules by mouth daily.    Yes Historical Provider, MD  simvastatin (ZOCOR) 20 MG tablet Take 20 mg by mouth at bedtime.     Yes Historical Provider, MD  Tamsulosin HCl (FLOMAX) 0.4 MG CAPS Take 0.4 mg by mouth daily.   Yes Historical Provider, MD  vitamin C (ASCORBIC ACID) 500 MG tablet Take 1,000 mg by mouth daily.    Yes Historical Provider, MD  aspirin 81 MG tablet Take 81 mg by  mouth every evening.     Historical Provider, MD  calcium carbonate (OS-CAL) 600 MG TABS Take 600 mg by mouth daily. Vit D 3     Historical Provider, MD     Vital Signs: BP 124/64 mmHg  Pulse 66  Temp(Src) 97.7 F (36.5 C)  Resp 18  Ht '5\' 8"'$  (1.727 m)  Wt 217 lb (98.431 kg)  BMI 33.00 kg/m2  SpO2 94%  Physical Exam  Constitutional: He is oriented to person, place, and time. No distress.  HENT:  Head: Normocephalic and atraumatic.  Cardiovascular: Normal rate and regular rhythm.   Murmur heard. Pulmonary/Chest: Effort normal and breath sounds normal. No respiratory distress. He has no wheezes. He has no rales.  Abdominal: Soft. Bowel sounds are normal.  Neurological: He is alert and oriented to person, place, and time.  Skin: He is not diaphoretic.    Mallampati Score:  MD Evaluation Airway: WNL Heart: WNL Abdomen: WNL Chest/ Lungs: WNL ASA  Classification: 3 Mallampati/Airway Score: Two  Labs:  CBC:  Recent Labs  03/20/14 0932 07/30/14 1204 08/19/14 1000  WBC 8.1 10.5 13.5*  HGB 14.5 10.2* 9.4*  HCT 42.8 32.4* 30.6*  PLT 180.0 251 306    COAGS:  Recent Labs  07/31/14 1006 08/19/14 1000  INR 1.37 1.49  APTT 33 32    BMP:  Recent Labs  07/07/14 1121 07/30/14  1204 07/31/14 0355 08/01/14 0403  NA 141 141 138 137  K 4.9 5.0 5.2* 4.4  CL 103 108 105 103  CO2 '27 27 25 26  '$ GLUCOSE 125* 188* 129* 128*  BUN 36* 39* 38* 35*  CALCIUM 9.2 10.6* 10.3 9.7  CREATININE 1.51* 1.55* 1.37* 1.40*  GFRNONAA  --  41* 48* 47*  GFRAA  --  48* 55* 54*    LIVER FUNCTION TESTS:  Recent Labs  06/17/14 0802 07/31/14 0355 07/31/14 0808  BILITOT 0.5  --  0.3  AST 21  --  30  ALT 18  --  28  ALKPHOS 46  --  49  PROT 6.1 6.5 7.0  ALBUMIN 3.5  --  2.6*    Assessment/Plan:  Progressive shortness of breath Hypermetabolic Left lower lobe lung mass s/p bronchoscopy with atypical cells -no malignancy  PET 08/14/14  Seen by Pulmonology 08/15/14 Scheduled  today for image guided LLL lung mass biopsy with possible chest tube placement and left thoracentesis with sedation The patient has been NPO, no blood thinners taken, labs and vitals have been reviewed. Risks and Benefits discussed with the patient including, but not limited to bleeding, hemoptysis, respiratory failure requiring intubation, infection, pneumothorax requiring chest tube placement, stroke from air embolism or even death. All of the patient's questions were answered, patient is agreeable to proceed. Consent signed and in chart. Former tobacco user BPH History of renal carcinoma s/p left nephrectomy  CAD s/p CABG   Signed: Hedy Jacob 08/19/2014, 10:50 AM

## 2014-08-19 NOTE — Discharge Instructions (Signed)
Needle Biopsy of Lung, Care After °Refer to this sheet in the next few weeks. These instructions provide you with information on caring for yourself after your procedure. Your health care provider may also give you more specific instructions. Your treatment has been planned according to current medical practices, but problems sometimes occur. Call your health care provider if you have any problems or questions after your procedure. °WHAT TO EXPECT AFTER THE PROCEDURE °· A bandage will be applied over the area where the needle was inserted. You may be asked to apply pressure to the bandage for several minutes to ensure there is minimal bleeding. °· In most cases, you can leave when your needle biopsy procedure is completed. Do not drive yourself home. Someone else should take you home. °· If you received an IV sedative or general anesthetic, you will be taken to a comfortable place to relax while the medicine wears off. °· If you have upcoming travel scheduled, talk to your health care provider about when it is safe to travel by air after the procedure. °HOME CARE INSTRUCTIONS °· Expect to take it easy for the rest of the day. °· Protect the area where you received the needle biopsy by keeping the bandage in place for as long as instructed. °· You may feel some mild pain or discomfort in the area, but this should stop in a day or two. °· Take medicines only as directed by your health care provider. °SEEK MEDICAL CARE IF:  °· You have pain at the biopsy site that worsens or is not helped by medicine. °· You have swelling or drainage at the needle biopsy site. °· You have a fever. °SEEK IMMEDIATE MEDICAL CARE IF:  °· You have new or worsening shortness of breath. °· You have chest pain. °· You are coughing up blood. °· You have bleeding that does not stop with pressure or a bandage. °· You develop light-headedness or fainting. °Document Released: 10/31/2006 Document Revised: 05/20/2013 Document Reviewed:  05/28/2012 °[employer]® Patient Information ©2015 [employer]. This information is not intended to replace advice given to you by your health care provider. Make sure you discuss any questions you have with your health care provider. ° °

## 2014-08-19 NOTE — Sedation Documentation (Signed)
Patient is resting comfortably. 

## 2014-08-19 NOTE — Procedures (Signed)
Successful LLL mass 18 g core bx No comp Stable Path pending Full report in PACS

## 2014-08-20 ENCOUNTER — Telehealth: Payer: Self-pay | Admitting: *Deleted

## 2014-08-20 NOTE — Telephone Encounter (Signed)
Called pt w/ friendly reminder & confirmed clinic appt for 8/4 w/ him and his daughter.

## 2014-08-21 ENCOUNTER — Ambulatory Visit
Admission: RE | Admit: 2014-08-21 | Discharge: 2014-08-21 | Disposition: A | Discharge status: Home / Self Care | Payer: Medicare Other | Source: Ambulatory Visit | Attending: Radiation Oncology | Admitting: Radiation Oncology

## 2014-08-21 ENCOUNTER — Telehealth: Payer: Self-pay | Admitting: Internal Medicine

## 2014-08-21 ENCOUNTER — Ambulatory Visit: Payer: Medicare Other | Admitting: Nutrition

## 2014-08-21 ENCOUNTER — Encounter: Payer: Self-pay | Admitting: *Deleted

## 2014-08-21 ENCOUNTER — Encounter: Payer: Self-pay | Admitting: Internal Medicine

## 2014-08-21 ENCOUNTER — Ambulatory Visit (HOSPITAL_BASED_OUTPATIENT_CLINIC_OR_DEPARTMENT_OTHER): Payer: Medicare Other | Admitting: Internal Medicine

## 2014-08-21 ENCOUNTER — Ambulatory Visit: Payer: Medicare Other | Attending: Internal Medicine | Admitting: Physical Therapy

## 2014-08-21 VITALS — BP 123/57 | HR 80 | Temp 98.4°F | Resp 22 | Ht 68.0 in | Wt 217.6 lb

## 2014-08-21 DIAGNOSIS — R5381 Other malaise: Secondary | ICD-10-CM | POA: Diagnosis not present

## 2014-08-21 DIAGNOSIS — J910 Malignant pleural effusion: Secondary | ICD-10-CM

## 2014-08-21 DIAGNOSIS — E041 Nontoxic single thyroid nodule: Secondary | ICD-10-CM

## 2014-08-21 DIAGNOSIS — R918 Other nonspecific abnormal finding of lung field: Secondary | ICD-10-CM

## 2014-08-21 DIAGNOSIS — Z8553 Personal history of malignant neoplasm of renal pelvis: Secondary | ICD-10-CM

## 2014-08-21 DIAGNOSIS — C786 Secondary malignant neoplasm of retroperitoneum and peritoneum: Secondary | ICD-10-CM | POA: Diagnosis not present

## 2014-08-21 DIAGNOSIS — R0602 Shortness of breath: Secondary | ICD-10-CM | POA: Diagnosis not present

## 2014-08-21 DIAGNOSIS — R29818 Other symptoms and signs involving the nervous system: Secondary | ICD-10-CM | POA: Diagnosis not present

## 2014-08-21 DIAGNOSIS — C3432 Malignant neoplasm of lower lobe, left bronchus or lung: Secondary | ICD-10-CM

## 2014-08-21 DIAGNOSIS — R2689 Other abnormalities of gait and mobility: Secondary | ICD-10-CM

## 2014-08-21 DIAGNOSIS — Z8489 Family history of other specified conditions: Secondary | ICD-10-CM

## 2014-08-21 DIAGNOSIS — R269 Unspecified abnormalities of gait and mobility: Secondary | ICD-10-CM | POA: Diagnosis not present

## 2014-08-21 DIAGNOSIS — C3490 Malignant neoplasm of unspecified part of unspecified bronchus or lung: Secondary | ICD-10-CM | POA: Insufficient documentation

## 2014-08-21 DIAGNOSIS — R591 Generalized enlarged lymph nodes: Secondary | ICD-10-CM

## 2014-08-21 DIAGNOSIS — E46 Unspecified protein-calorie malnutrition: Secondary | ICD-10-CM

## 2014-08-21 HISTORY — DX: Unspecified protein-calorie malnutrition: E46

## 2014-08-21 MED ORDER — METHYLPREDNISOLONE 4 MG PO TBPK: ORAL_TABLET | ORAL | Status: DC

## 2014-08-21 NOTE — Progress Notes (Signed)
[place] [phone]   Fax:([phone] Multidisciplinary thoracic oncology clinic  CONSULT NOTE  REFERRING PHYSICIAN: Dr. Baltazar Apo  REASON FOR CONSULTATION:  80 years old white male with questionable metastatic lung cancer.  HPI John Potter is a 80 y.o. male was past medical history significant for hypertension, dyslipidemia, dry R disease status post CABG 1991, chronic kidney disease secondary to renal cancer status post left nephrectomy in 1999, diabetes mellitus as well as remote history of smoking but quit in 1975. Over the last 6 months the patient has been complaining of increasing fatigue and weakness as well as shortness of breath. He also had swelling of the lower extremities and worsening fatigue. He presented to the emergency department at [facility] on 07/30/2014 for evaluation. Chest x-ray performed on the day of admission showed large left pleural effusion with passive atelectasis with effusion occupying up to 75% of the left hemothorax thoracic volume and there was suspected enlargement of the cardiopericardial silhouette. On 07/31/2014 the patient underwent left sided thoracentesis with drainage of 1250 ML contrast colored pleural effusion but the final cytology showed no malignant cells. CT scan of the chest was performed on 07/31/2014 and it showed 11.3 x 8.8 cm left lower lobe mass extending to the left infrahilar/perihilar region, compatible with primary bronchogenic neoplasm. Associated small to moderate loculated left pleural effusion, likely malignant. Mild branching nodularity in the posterior right upper lobe,suspicious for endobronchial spread of infection or tumor. Thoracic and upper abdominal nodal metastases, including: 14 mm short axis node at the left thoracic inlet, 11 mm short axis AP window node, 13 mm short axis prevascular node, Abnormal soft tissue in the left hilar region, 17 mm short axis subcarinal node, 2.3 cm  left inferior thyroid nodule. The patient was referred to Dr. Lamonte Sakai after discharge from the hospital and he underwent a video bronchoscopy with bronchial brushings and Wang needle biopsies from the left lower lobe as well as station 7 node on 08/04/2014. The final cytology (Accession: [id number]) is a bronchial brushings showed highly atypical cells but not diagnostic for malignancy.  The patient underwent a PET scan on 08/14/2014 and it showed findings compatible with a stage IV (T4, N3, M1 B) lung cancer with a large mass with epicenter in the left lower lobe, lymphangitic spread of tumor in the left upper lobe, malignant left pleural effusion, left hilar and bilateral mediastinal lymphadenopathy as well as metastatic disease to the peritoneum and visualized axial and appendicular skeleton. There was also a hypermetabolic 1.9 cm left thyroid nodule questionable for metastasis or primary thyroid neoplasm. There was also a 3.2 x 1.3 cm in gated hypermetabolic lesion in the right upper lobe favored to represent an infected bronchial and less likely present metastatic lesion. On 08/19/2014 the patient underwent CT-guided core biopsy of the left lower lobe lung mass. The final pathology is still pending but on personal communication with Dr. Tillman Sers, he indicated the presence of malignancy but the subtype is not normal and he is currently doing more immunohistochemical stains to identify the primary source of this malignancy.. Dr. Lamonte Sakai kindly referred the patient to the multidiscipline thoracic oncology clinic today for evaluation and discussion of his treatment options. When seen today the patient continues to complain of generalized weakness as well as shortness of breath and lack of appetite and dysphagia. He also has swelling of the lower extremities. He denied having any significant headache or visual changes. He has mild nausea and constipation.  Family history significant for mother with congestive heart  failure, father had melanoma and brother with skin cancer. The patient is a widow and has 2 daughters. He was accompanied by his 2 daughter [name] and Karna Christmas. 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 He has a history of smoking less than one pack per day for around 17 years but quit in 1975. No history of alcohol or drug abuse.  HPI  Past Medical History  Diagnosis Date  . CAD (coronary artery disease)     sees Dr. Dani Gobble Cro[place]   . Hypertension   . Hyperlipidemia   . Rheumatic fever "I was an infant"  . Hyperglycemia   . BPH (benign prostatic hyperplasia)     sees Dr. Junious Silk   . Heart murmur dx'd 1958  . Type II diabetes mellitus dx'd ~ 04/2014    "mild; advised by MD to lose weight; no RX" (07/30/2014)  . History of gout   . Kidney carcinoma 1999    sees Dr. Junious Silk   . Malnutrition related to chronic disease 08/21/2014    Past Surgical History  Procedure Laterality Date  . Coronary artery bypass graft  1991    CABG X 3; per Dr. Redmond Pulling   . Nephrectomy Left 1999     per Dr. Hessie Diener   . Vasectomy    . Tonsillectomy and adenoidectomy    . Colonoscopy  2010    clear, repeat in 10 yrs   . US echocardiography  07/14/2009    mod-severe LVH,EF =>55%,LA mildly dilated,mild mitral & aortic annular ca+, AOV mod. sclerotic,discrete nodular thickening of the non-coronary cusp  . Cataract extraction w/ intraocular lens  implant, bilateral Bilateral 2000's  . Cardiac catheterization  1991  . Video bronchoscopy Bilateral 08/04/2014    Procedure: VIDEO BRONCHOSCOPY WITH FLUORO;  Surgeon: Collene Gobble, MD;  Location: [facility];  Service: Cardiopulmonary;  Laterality: Bilateral;    Family History  Problem Relation Age of Onset  . Heart disease Father   . Hyperlipidemia Father   . Hypertension Father   . Diabetes Father     Social History History  Substance Use Topics  . Smoking status: Former Smoker -- 1.00 packs/day for 16 years     Types: Cigarettes    Quit date: 07/17/1973  . Smokeless tobacco: Never Used  . Alcohol Use: No    Allergies  Allergen Reactions  . Amoxicillin     rash    Current Outpatient Prescriptions  Medication Sig Dispense Refill  . aspirin EC 81 MG tablet Take 81 mg by mouth every evening.    . Calcium Carb-Cholecalciferol (CALCIUM 600 + D) 600-200 MG-UNIT TABS Take 1 tablet by mouth daily.    . ferrous sulfate 325 (65 FE) MG tablet TAKE 1 TABLET BY MOUTH ONCE DAILY WITH BREAKFAST 30 tablet 11  . fish oil-omega-3 fatty acids 1000 MG capsule Take 2 g by mouth daily.      . furosemide (LASIX) 40 MG tablet Take 1 tablet (40 mg total) by mouth 3 (three) times a week. 90 tablet 3  . hydrocortisone 2.5 % cream Apply topically 2 (two) times daily. 30 g 0  . lisinopril (PRINIVIL,ZESTRIL) 5 MG tablet Take 1 tablet (5 mg total) by mouth daily. 30 tablet 0  . metoprolol tartrate (LOPRESSOR) 25 MG tablet Take 25 mg by mouth 2 (two) times daily.      . Multiple Vitamin (MULTIVITAMIN) tablet Take 1  tablet by mouth daily.      . Multiple Vitamins-Minerals (PRESERVISION/LUTEIN) CAPS Take 2 capsules by mouth daily.     . simvastatin (ZOCOR) 20 MG tablet Take 20 mg by mouth at bedtime.      . Tamsulosin HCl (FLOMAX) 0.4 MG CAPS Take 0.4 mg by mouth daily.    . vitamin C (ASCORBIC ACID) 500 MG tablet Take 1,000 mg by mouth daily.     . methylPREDNISolone (MEDROL DOSEPAK) 4 MG TBPK tablet Use as instructed 21 tablet 0   No current facility-administered medications for this visit.    Review of Systems  Constitutional: positive for anorexia and fatigue Eyes: negative Ears, nose, mouth, throat, and face: negative Respiratory: positive for cough and dyspnea on exertion Cardiovascular: negative Gastrointestinal: negative Genitourinary:negative Integument/breast: negative Hematologic/lymphatic: negative Musculoskeletal:positive for muscle weakness Neurological: negative Behavioral/Psych:  negative Endocrine: negative Allergic/Immunologic: negative  Physical Exam  HAL:PFXTK, healthy, no distress, well nourished and well developed SKIN: skin color, texture, turgor are normal, no rashes or significant lesions HEAD: Normocephalic, No masses, lesions, tenderness or abnormalities EYES: normal, PERRLA, Conjunctiva are pink and non-injected EARS: External ears normal, Canals clear OROPHARYNX:no exudate, no erythema and lips, buccal mucosa, and tongue normal  NECK: supple, no adenopathy, no JVD LYMPH:  no palpable lymphadenopathy, no hepatosplenomegaly LUNGS: coarse sounds heard, decreased breath sounds HEART: regular rate & rhythm, no murmurs and no gallops ABDOMEN:abdomen soft, non-tender, normal bowel sounds and no masses or organomegaly BACK: Back symmetric, no curvature., No CVA tenderness EXTREMITIES: 2+ edema in the lower extremities.  NEURO: alert & oriented x 3 with fluent speech, no focal motor/sensory deficits  PERFORMANCE STATUS: ECOG 1-2  LABORATORY DATA: Lab Results  Component Value Date   WBC 13.5* 08/19/2014   HGB 9.4* 08/19/2014   HCT 30.6* 08/19/2014   MCV 85.5 08/19/2014   PLT 306 08/19/2014      Chemistry      Component Value Date/Time   NA 137 08/01/2014 0403   K 4.4 08/01/2014 0403   CL 103 08/01/2014 0403   CO2 26 08/01/2014 0403   BUN 35* 08/01/2014 0403   CREATININE 1.40* 08/01/2014 0403   CREATININE 1.51* 07/07/2014 1121      Component Value Date/Time   CALCIUM 9.7 08/01/2014 0403   ALKPHOS 49 07/31/2014 0808   AST 30 07/31/2014 0808   ALT 28 07/31/2014 0808   BILITOT 0.3 07/31/2014 0808       RADIOGRAPHIC STUDIES: Dg Chest 1 View  08/19/2014   CLINICAL DATA:  Status post biopsy left lower lobe mass  EXAM: CHEST  1 VIEW  COMPARISON:  Chest radiograph August 04, 2014; chest CT July 31, 2014  FINDINGS: No pneumothorax. There is mass with consolidation throughout the left lower lobe region. There is atelectatic change throughout the  left upper lobe. The right lung is clear. Heart size is upper normal with pulmonary vascular within normal limits. Patient is status post internal mammary bypass grafting. No adenopathy appreciable.  IMPRESSION: No pneumothorax. Mass with consolidation on the left, essentially stable. Right lung clear. No change in cardiac silhouette.   Electronically Signed   By: Lowella Grip III M.D.   On: 08/19/2014 13:50   Dg Chest 2 View  07/30/2014   CLINICAL DATA:  Shortness of breath.  EXAM: CHEST  2 VIEW  COMPARISON:  Report from 07/04/2002  FINDINGS: Large left pleural effusion occupying 75% of left hemithoracic volume. Multiple old left rib deformities.  Prior CABG. Atherosclerotic aortic arch. Suspected enlargement  of the cardiopericardial silhouette (left heart border obscured).  The right lung appears clear.  Thoracic spondylosis noted.  IMPRESSION: 1. Large left pleural effusion with passive atelectasis. The effusion occupies about 75% of left hemithoracic volume. 2. Suspected enlargement of the cardiopericardial silhouette but no overt edema. Prior CABG. 3. Atherosclerotic aortic arch.   Electronically Signed   By: Van Clines M.D.   On: 07/30/2014 12:47   Ct Chest Wo Contrast  07/31/2014   CLINICAL DATA:  Evaluate pleural effusion  EXAM: CT CHEST WITHOUT CONTRAST  TECHNIQUE: Multidetector CT imaging of the chest was performed following the standard protocol without IV contrast.  COMPARISON:  Chest radiograph dated 07/31/2014  FINDINGS: Mediastinum/Nodes: Heart is normal in size. No pericardial effusion.  Coronary atherosclerosis. Postsurgical changes related to prior CABG.  Atherosclerotic calcifications of the aortic arch.  Thoracic lymphadenopathy, including:  --14 mm short axis node at the left thoracic inlet (series 21/image 13)  --11 mm short axis AP window node (series 21/image 23)  --13 mm short axis prevascular node (series 21/image 25)  --Abnormal soft tissue in the left hilar region  (series 21/image 28)  --17 mm short axis subcarinal node (series 21/image 31)  2.3 cm left inferior thyroid nodule (series 21/image 13).  Lungs/Pleura: 11.3 x 8.8 cm left lower lobe mass (series 21/ image 42) extending to the left infrahilar/ perihilar region.  Associated small to moderate loculated left pleural effusion, likely malignant.  Mild branching nodularity in the posterior right upper lobe (series 203/images 24 and 25), suspicious for endobronchial spread of infection or tumor.  No pneumothorax status post thoracentesis.  Upper abdomen: Visualized upper abdomen is notable for two nodes at the GE junction measuring up to 14 mm short axis (series 201/ image 49), cholelithiasis, and surgical clips in the left upper abdomen.  Musculoskeletal: Degenerative changes of the visualized thoracolumbar spine.  IMPRESSION: 11.3 x 8.8 cm left lower lobe mass extending to the left infrahilar/ perihilar region, compatible with primary bronchogenic neoplasm.  Associated small to moderate loculated left pleural effusion, likely malignant.  Mild branching nodularity in the posterior right upper lobe, suspicious for endobronchial spread of infection or tumor.  Thoracic and upper abdominal nodal metastases, as above.  No pneumothorax.   Electronically Signed   By: Julian Hy M.D.   On: 07/31/2014 17:48   Nm Pet Image Initial (pi) Skull Base To Thigh  08/14/2014   CLINICAL DATA:  Initial treatment strategy for lung mass.  EXAM: NUCLEAR MEDICINE PET SKULL BASE TO THIGH  TECHNIQUE: Template mCi W-65 FDG was injected intravenously. Full-ring PET imaging was performed from the skull base to thigh after the radiotracer. CT data was obtained and used for attenuation correction and anatomic localization.  FASTING BLOOD GLUCOSE:  Value: 129 mg/dl  COMPARISON:  Chest CT 07/31/2014.  FINDINGS: NECK  No hypermetabolic lymph nodes in the neck.  CHEST  Large infiltrative hypermetabolic (SUVmax = 68.1-27.5) mass centered in the  left lower lobe measuring at least 9.8 x 11.8 cm. There is a nodular thickening of the peribronchovascular and subpleural interstitium in the left upper lobe with multifocal hyper metabolism, compatible with lymphangitic spread of tumor. Moderate partially loculated pleural effusion with multifocal pleural hypermetabolism and nodularity, compatible with a malignant pleural effusion. Innumerable enlarged and hypermetabolic left hilar and mediastinal lymph nodes, including the largest lymph node in the subcarinal nodal station which measures up to 18 mm in short axis (SUVmax = 14.0). One of these hypermetabolic lymph nodes (SUVmax = 5.5)is  a low right paratracheal lymph node measuring 11 mm (image 75 of series 4). No right hilar lymphadenopathy. In addition, there is lymphadenopathy in the superior mediastinum, with a 14 mm short axis lymph node adjacent to the proximal left subclavian artery (SUVmax = 16.5). 3.2 x 1.3 cm hypermetabolic (SUVmax = 0.1)BPZWCH in the right lung is favored to represent an infected bronchocele, and is less likely to represent a metastasis or second primary lesion. Small right pleural effusion lying dependently. Heart size is normal. There is no significant pericardial fluid, thickening or pericardial calcification. There is atherosclerosis of the thoracic aorta, the great vessels of the mediastinum and the coronary arteries, including calcified atherosclerotic plaque in the left main, left anterior descending, left circumflex and right coronary arteries. Status post median sternotomy for CABG, including LIMA to the LAD. Severe calcifications of the aortic valve. 1.9 cm hypermetabolic (SUVmax = 8.5)IDPO thyroid nodule.  ABDOMEN/PELVIS  No abnormal hypermetabolic activity within the liver, pancreas, adrenal glands, or spleen. 11 mm soft tissue attenuation nodule (image 159 of series 4) in the left lower quadrant of the abdomen is hypermetabolic (SUVmax = 7.0), concerning for a metastatic  peritoneal implant. Calcified gallstones lying dependently in the gallbladder. No current findings to suggest an acute cholecystitis at this time. Postoperative changes of left-sided radical nephrectomy. Multiple low-attenuation lesions in the region of the right renal pelvis, presumably parapelvic cysts.  SKELETON  Multifocal sites of skeletal hypermetabolism throughout the visualized axial and appendicular skeleton, compatible with widespread skeletal metastasis. The largest site of hyper metabolism is a very ill-defined lucent lesion in the left side of the T11 vertebral body (SUVmax = 8.8).  IMPRESSION: 1. Findings, as above, compatible with stage IV (T4, N3, M1b) lung cancer. Specifically, there is a large mass with epicenter in the left lower lobe, lymphangitic spread of tumor in the left upper lobe, malignant left pleural effusion, left hilar and bilateral mediastinal lymphadenopathy, as well as metastatic disease to the peritoneal and visualized axial and appendicular skeleton, as detailed above. 2. Hypermetabolic 1.9 cm left thyroid nodule. This could represent a metastasis or primary thyroid neoplasm. 3. 3.2 x 1.3 cm elongated hypermetabolic lesion in the right upper lobe is favored to represent an infected bronchocele, and is less likely to represent a metastatic lesion. 4. Additional incidental findings, as above.   Electronically Signed   By: Vinnie Langton M.D.   On: 08/14/2014 12:39   Ct Biopsy  08/19/2014   CLINICAL DATA:  LARGE HYPERMETABOLIC LEFT LOWER LOBE MASS  EXAM: CT GUIDED CORE BIOPSY OF LEFT LOWER LOBE MASS  ANESTHESIA/SEDATION: 0.5  Mg IV Versed; 25 mcg IV Fentanyl  Total Moderate Sedation Time: 15 minutes.  PROCEDURE: The procedure risks, benefits, and alternatives were explained to the patient. Questions regarding the procedure were encouraged and answered. The patient understands and consents to the procedure.  The LEFT POSTERIOR BACK was prepped with Betadinein a sterile fashion,  and a sterile drape was applied covering the operative field. A sterile gown and sterile gloves were used for the procedure. Local anesthesia was provided with 1% Lidocaine.  Previous imaging reviewed. Patient positioned left side down decubitus. Noncontrast localization CT performed. The large left lower lobe mass was localized with CT. Under sterile conditions and local anesthesia, a 17 gauge 6.8 cm access needle was advanced from a posterior intercostal approach into the lesion along the posterior margin. Needle position confirmed with CT. 18 gauge core biopsies obtained. Samples placed in formalin. Needle tract embolized with a  biosentry device.  Complications: None immediate  FINDINGS: Imaging confirms needle placement into the large left lower lobe mass from a posterior approach for core biopsy  IMPRESSION: Successful CT-guided left lower lobe mass 18 gauge core biopsy   Electronically Signed   By: Jerilynn Mages.  Shick M.D.   On: 08/19/2014 13:05   Dg Chest Port 1 View  08/04/2014   CLINICAL DATA:  Status post bronchoscopy.  EXAM: PORTABLE CHEST - 1 VIEW  COMPARISON:  07/31/2014  FINDINGS: Chronic cardiomegaly. There is new pulmonary venous congestion.No evidence of air leak. Dense left lower lobe mass with pleural thickening, likely malignant as described on chest CT 07/31/2014. Changes of CABG.  IMPRESSION: 1. Pulmonary venous congestion. 2. No air leak after bronchoscopy. 3. Known large left lower lobe mass with pleural fluid and thickening.   Electronically Signed   By: Monte Fantasia M.D.   On: 08/04/2014 12:47   Dg Chest Port 1 View  07/31/2014   CLINICAL DATA:  Status post left thoracentesis. Patient symptomatically improved.  EXAM: PORTABLE CHEST - 1 VIEW  COMPARISON:  07/30/2014.  FINDINGS: There is decreased opacity throughout the mid and lower lung consistent with significant decreased pleural fluid. Residual opacity may reflect pneumonia or inflammation, atelectasis or a combination.  There is no  pneumothorax. Right lung remains clear. Stable left upper lobe mass like opacity.  IMPRESSION: 1. Significant decrease in pleural fluid on the left following thoracentesis. No pneumothorax.   Electronically Signed   By: Lajean Manes M.D.   On: 07/31/2014 14:27   Dg C-arm Bronchoscopy  08/04/2014   CLINICAL DATA:    C-ARM BRONCHOSCOPY  Fluoroscopy was utilized by the requesting physician.  No radiographic  interpretation.     ASSESSMENT: This is a very pleasant 80 years old white male recently diagnosed with a most likely stage IV (T4, N3, M1b) non-small cell lung cancer pending further immunohistochemical stains to identify the primary histology of this malignancy. The patient has a history of renal cell carcinoma as well as family history of melanoma, so these are not excluded at this point.   PLAN: I had a lengthy discussion with the patient and his family today about his current disease stage, prognosis and treatment options. The patient and his family understand that we would have to wait for the final pathology to confirm the diagnosis. I will complete the staging workup by ordering a MRI of the brain to rule out brain metastasis. I will arrange for the patient to come back for follow-up visit in one week for reevaluation and discussion of his treatment options based on the final pathology. If the final pathology is consistent with adenocarcinoma, I will send his tissue to be tested for EGFR mutation as well as ALK gene translocation. For the lack of appetite, I started the patient on Medrol Dosepak and also requested evaluation by the dietitian at the [facility] for evaluation of his nutritional status. The patient was also seen by Dr. Pablo Ledger from radiation oncology for discussion of palliative radiotherapy needed. The patient was seen at the multidisciplinary thoracic oncology clinic today by medical oncology, radiation oncology, thoracic navigator, physical therapist, social worker as  well as oncology dietitian. He was advised to call immediately if he has any concerning symptoms in the interval.  The patient voices understanding of current disease status and treatment options and is in agreement with the current care plan.  All questions were answered. The patient knows to call the clinic with any problems, questions  or concerns. We can certainly see the patient much sooner if necessary.  Thank you so much for allowing me to participate in the care of Venita Sheffield. I will continue to follow up the patient with you and assist in his care.  I spent 55 minutes counseling the patient face to face. The total time spent in the appointment was 80 minutes.  Disclaimer: This note was dictated with voice recognition software. Similar sounding words can inadvertently be transcribed and may not be corrected upon review.   Maliik Karner K. August 21, 2014, 2:26 PM

## 2014-08-21 NOTE — Telephone Encounter (Signed)
Pt confirmed labs/ov per 08/04 POF, gave pt avs and calendar.... KJ °

## 2014-08-21 NOTE — Progress Notes (Signed)
[facility] [facility] Clinical Social Work  Clinical Social Work met with patient/family and Futures trader at [facility] appointment to offer support and assess for psychosocial needs.  Medical oncologist reviewed patient's diagnosis, need for more testing to determine pathology, and recommended treatment options with patient/family.  The patient was accompanied by his two daughters, John Potter and John Potter.  Mr. John Potter spouse passed away three years ago.  He enjoys going to baseball games, but has been unable to go since he has experienced fatigue and shortness of breath.  Mr. [name] shared his main concern is "getting [his] energy back".  Patient currently has loss of appetite- CSW requested dietitian support.    Clinical Social Work briefly discussed Clinical Social Work role and [employer] support programs/services.  Clinical Social Work encouraged patient to call with any additional questions or concerns.   Polo Riley, MSW, LCSW, OSW-C Clinical Social Worker [facility] [phone]

## 2014-08-21 NOTE — Addendum Note (Signed)
Addended by: Curt Bears on: 08/21/2014 04:45 PM   Modules accepted: Level of Service

## 2014-08-21 NOTE — Progress Notes (Signed)
Oncology Nurse Navigator Documentation  Oncology Nurse Navigator Flowsheets 08/21/2014  Navigator Encounter Type Clinic/MDC  Patient Visit Type Initial  Treatment Phase Abnormal Scans  Barriers/Navigation Needs Education  Interventions Education Method  Education Method Written  Time Spent with Patient 30         Thoracic Treatment Summary Name:John Potter Date:08/21/2014 DOB:[birth year] Your Medical Team Medical Oncologist:Dr. Julien Nordmann Radiation Oncologist:Dr. Pablo Ledger   Type and Stage of Lung Cancer  Unknown histology  Clinical Stage:  Unknown at this time   Clinical stage is based on radiology exams.  Pathological stage will be determined after surgery.  Staging is based on the size of the tumor, involvement of lymph nodes or not, and whether or not the cancer center has spread. Recommendations Recommendations: further testing and tissue analysis  These recommendations are based on information available as of today's consult.  This is subject to change depending further testing or exams. Next Steps Next Step: Medical Oncology will set up follow up appointments  Radiation Oncology will set up follow up appointments Barriers to Care What do you perceive as a potential barrier that may prevent you from receiving your treatment plan? Education. I gave and explained general information today.  I will be able to give more information once we have histology    Resources Given: NCI Booklet on [facility].Radonna Ricker [phone]    Questions Norton Blizzard, RN BSN Thoracic Oncology Nurse Navigator at [facility] is a nurse navigator that is available to assist you through your cancer journey.  She can answer your questions and/or provide resources regarding your treatment plan, emotional support, or financial concerns.

## 2014-08-21 NOTE — Progress Notes (Signed)
80 year old male diagnosed with Stage IV carcinoma "pathology pending".   Past medical history includes CAD, hyperlipidemia, hypertension, diabetes, kidney cancer with left nephrectomy and gout.  Medications include ferrous sulfate, omega-3 fatty acid, Lasix, multivitamin, Zocor, and vitamin C.  M.D. ordered Medrol dose pack today to help with appetite.  Labs were reviewed.  Height: 68 inches. Weight: 217.6 pounds Usual body weight: 242 pounds March 2016. BMI: 33.09.  I met with patient and his 2 daughters in an exam room, during lung clinic. Patient reports poor appetite for the last 2-3 weeks. Patient reports some difficulty swallowing. Patient also describes nausea after eating. Oral intake has decreased dramatically over the past week. Patient has consumed boost in the past.  Nutrition diagnosis: Inadequate oral intake related to stage IV cancer, poor appetite as evidenced by 10% weight loss in 5 months.  Intervention: Educated patient to consume small frequent snacks/meals every 2-3 hours. Reviewed importance of increased calorie and protein containing foods. Recommended patient drink boost compact 3 times a day to 4 times a day Educated patient on strategies for eating if he feels nauseated. Provided fact sheets on poor appetite, increasing calories and protein, and nausea vomiting.  Also provided recipes for milkshakes. Provided coupons, and my contact information. Questions were answered and teach back method was used.  Monitoring, evaluation, goals: Patient will tolerate increased calories and protein to minimize loss of lean body mass and promote improved quality-of-life.    Next visit: Wednesday, August 10.  **Disclaimer: This note was dictated with voice recognition software. Similar sounding words can inadvertently be transcribed and this note may contain transcription errors which may not have been corrected upon publication of note.**

## 2014-08-21 NOTE — Therapy (Signed)
[place], [place], [address] Phone: [phone]   Fax:  [phone]  Physical Therapy Evaluation  Patient Details  Name: John Potter MRN: [id number] Date of Birth: 11-Aug-1934 Referring Provider:  Curt Bears, MD  Encounter Date: 08/21/2014      PT End of Session - 08/21/14 1451    Visit Number 1   Number of Visits 1   Date for PT Re-Evaluation 10/20/14   PT Start Time 7035   PT Stop Time 1335   PT Time Calculation (min) 30 min   Activity Tolerance Treatment limited secondary to medical complications (Comment)   Behavior During Therapy [facility] for tasks assessed/performed      Past Medical History  Diagnosis Date  . CAD (coronary artery disease)     sees Dr. Dani Gobble Croitoru   . Hypertension   . Hyperlipidemia   . Rheumatic fever "I was an infant"  . Hyperglycemia   . BPH (benign prostatic hyperplasia)     sees Dr. Junious Silk   . Heart murmur dx'd 1958  . Type II diabetes mellitus dx'd ~ 04/2014    "mild; advised by MD to lose weight; no RX" (07/30/2014)  . History of gout   . Kidney carcinoma 1999    sees Dr. Junious Silk   . Malnutrition related to chronic disease 08/21/2014    Past Surgical History  Procedure Laterality Date  . Coronary artery bypass graft  1991    CABG X 3; per Dr. Redmond Pulling   . Nephrectomy Left 1999     per Dr. Hessie Diener   . Vasectomy    . Tonsillectomy and adenoidectomy    . Colonoscopy  2010    clear, repeat in 10 yrs   . US echocardiography  07/14/2009    mod-severe LVH,EF =>55%,LA mildly dilated,mild mitral & aortic annular ca+, AOV mod. sclerotic,discrete nodular thickening of the non-coronary cusp  . Cataract extraction w/ intraocular lens  implant, bilateral Bilateral 2000's  . Cardiac catheterization  1991  . Video bronchoscopy Bilateral 08/04/2014    Procedure: VIDEO BRONCHOSCOPY WITH FLUORO;  Surgeon: Collene Gobble, MD;  Location: [facility];  Service:  Cardiopulmonary;  Laterality: Bilateral;    There were no vitals filed for this visit.  Visit Diagnosis:  Balance problem - Plan: PT plan of care cert/re-cert  Shortness of breath on exertion - Plan: PT plan of care cert/re-cert  Gait disorder - Plan: PT plan of care cert/re-cert  Physical deconditioning - Plan: PT plan of care cert/re-cert      Subjective Assessment - 08/21/14 1343    Subjective Pt. c/o shortness of breath, weakness.   Patient is accompained by: Family member  2 dtrs.   Pertinent History Patient presented to ED with shortness of breath 07/31/14; underwent chest x-ray and CT, and later brochoscoy and PET, then CT biopsy.  Diagnosis so far is left lower lobe highly atypical cells with lymphadenopathy, malignant pleural effusion, hypermetabolism in peritoneum and skeletal areas. Ex-smoker quit 1975.  Renal CA s/p resection 1999.  CAD s/p CABG 1991.  HTN, hyperlipidemia, hyperglycemia, BPH, Type II DM.  h/o back trouble with multiple iinsults from doing heavy lifting for his work over [employer].  Currently in Pain? No/denies  but h/o back pain; now just feels weak            [facility] PT Assessment - 08/21/14 0001    Assessment   Medical Diagnosis atypical cells in pleural fluid of as yet unidentified type; metastases likely in peritoneum and skeleton   Precautions   Precautions Other (comment)  cancer precautions; possible bony mets   Restrictions   Weight Bearing Restrictions No   Balance Screen   Has the patient fallen in the past 6 months No   Has the patient had a decrease in activity level because of a fear of falling?  No   Is the patient reluctant to leave their home because of a fear of falling?  No   Home Environment   Living Environment Private residence   Living Arrangements Alone  but family looks after him, stopping in and staying  at night   Available Help at Discharge Family   Type of Home Other(Comment)   [place] One level   Prior Function   Level of Independence Independent  until this episode starting 07/31/14   Leisure >3 years ago, walked 3 miles a day; hasn't lately since wife passed away   Cognition   Overall Cognitive Status Within Functional Limits for tasks assessed   Observation/Other Assessments   Observations quiet man looking his stated age; in wheelchair today   Functional Tests   Functional tests Sit to Stand   Sit to Stand   Comments able to stand without using UE support on his second try, but with mild loss of balance and need to have his legs against chair seat   Posture/Postural Control   Posture/Postural Control Postural limitations   Postural Limitations Rounded Shoulders;Forward head   ROM / Strength   AROM / PROM / Strength AROM;Strength   AROM   Overall AROM Comments UEs and LEs grossly WFL throughout   Strength   Overall Strength Comments not formally tested due to possible bony metastases; patient reports weakness, he thinks because he has not been eating much recently   Palpation   Palpation comment bilateral ankle swelling visible and palpable; pt. is on Lasix for this   Ambulation/Gait   Ambulation/Gait Yes   Ambulation/Gait Assistance 4: Min assist   Ambulation/Gait Assistance Details At eval, used two hand hold assist   Ambulation Distance (Feet) 10 Feet  became short of breath at this distance   Assistive device Rolling walker   Ambulation Surface Level;Indoor   Balance   Balance Assessed No  but had mild loss of balance with turns on gait                           PT Education - 08/21/14 1450    Education provided Yes   Education Details posture, breathing, walking or staying active with other exercise, energy conservation, Cure article on staying active, physical therapy info   Person(s) Educated  Patient;Child(ren)   Methods Explanation;Handout   Comprehension Verbalized understanding               Lung Clinic Goals - 08/21/14 1457    Patient will be able to verbalize understanding of the benefit of exercise to decrease fatigue.   Status Achieved   Patient will be able to verbalize the importance of posture.   Status Achieved   Patient will be able to demonstrate diaphragmatic breathing for improved  lung function.   Status Achieved   Patient will be able to verbalize understanding of the role of physical therapy to prevent functional decline and who to contact if physical therapy is needed.   Status Achieved             Plan - 2014-08-24 1452    Clinical Impression Statement Patient with as yet unidentified malignancy that has metastasized; he reports weakness and shortness of breath.  He became short of breath after walking just 10 feet with min assist of therapist.  Pt. may benefit from therapy going forward to work on balance, gait safety, and endurance.   Pt will benefit from skilled therapeutic intervention in order to improve on the following deficits Cardiopulmonary status limiting activity;Decreased balance;Decreased activity tolerance;Decreased strength   Rehab Potential Fair   PT Frequency One time visit   PT Treatment/Interventions Patient/family education   PT Next Visit Plan None at this time; may benefit from therapy for balance and endurance as he does treatment.   PT Home Exercise Plan see education section   Consulted and Agree with Plan of Care Patient;Family member/caregiver          G-Codes - 08-24-2014 1457    Functional Assessment Tool Used clinical judgement   Functional Limitation Mobility: Walking and moving around   Mobility: Walking and Moving Around Current Status [phone]) At least 60 percent but less than 80 percent impaired, limited or restricted   Mobility: Walking and Moving Around Goal Status [phone]) At least 60 percent but less than  80 percent impaired, limited or restricted   Mobility: Walking and Moving Around Discharge Status [phone]) At least 60 percent but less than 80 percent impaired, limited or restricted       Problem List Patient Active Problem List   Diagnosis Date Noted  . Lung cancer 24-Aug-2014  . Malnutrition related to chronic disease 08-24-14  . Anorexia 08/15/2014  . Lung mass 08/04/2014  . Acute on chronic diastolic CHF (congestive heart failure), NYHA class 3   . DOE (dyspnea on exertion) 07/30/2014  . Pleural effusion on left 07/30/2014  . Hyperglycemia 01/29/2013  . CAD (coronary artery disease) 07/08/2012  . Hyperlipidemia 07/08/2012  . Essential hypertension 07/08/2012  . Obesity (BMI 30-39.9) 07/08/2012  . LBBB (left bundle branch block) 07/08/2012  . Cardiac murmur 07/08/2012  . CKD (chronic kidney disease) stage 3, GFR 30-59 ml/min 07/08/2012    SALISBURY,DONNA 08-24-2014, 2:59 PM  [place], [place], [address] Phone: [phone]   Fax:  [place], PT 08-24-2014 3:00 PM

## 2014-08-21 NOTE — Progress Notes (Signed)
  Radiation Oncology         [phone]) [phone] ________________________________  Initial Outpatient Consultation - Date: 08/21/2014   Name: John Potter MRN: [id number]   DOB: [birth year]  REFERRING PHYSICIAN: Collene Gobble, MD  DIAGNOSIS AND STAGE: Weber Monnier is a 80 year old man presenting to clinic in regards to his Stage IV carinoma of unknown origin.  HISTORY OF PRESENT ILLNESS:John Potter is a 80 y.o. male  presenting to clinic in regards to his Stage Iv carcinoma "pathology pending". He presents with a six month history of a shortness of breath. This has increased and is associated with decreased appetite and lower extremity swelling. He is a former smoker, smoking one pack a day for sixteen years, but quit on 07/17/1973. He had a CT of the chest on 07/31/2014 which showed an 11cm lower lobe mass extending to the left helium with associated left plural effusion and metastatic adenopathy to the mediastinal lymph nodes. A bronchoscopy and biopsy was performed on 08/04/2014 which showed highly atypical cells, which were not diagnostic for malignancy. On the bronchoscopy the left lobe airways were narrowed but no endobronchial lesions were seen. A CT guided biopsy was performed on 08/19/2014 and the pathology from this is pending. He does have a history of renal cancer and melanoma. Stains are pending to rule these out. His main complaint for today's appointment was decreased appetite. He was accompanied by his daughters. He has not had brain imaging. He has been evaluated by Dr. Inda Merlin and systemic chemotherapy is planned as soon as the results of his biopsy are complete. He has no complaints of pain at this time and no hemoptysis.   PREVIOUS RADIATION THERAPY: No  Past medical, social and family history were reviewed in the electronic chart. Review of symptoms was reviewed in the electronic chart. Medications were reviewed in the electronic chart.   PHYSICAL EXAM: The patient is alert and  oriented x3. 123/57 mmHg 80 98.4 F (36.9 C) (Oral) 22 $RemoveB'5\' 8"'NXwAsCqQ$  (1.727 m) 217 lb 9.6 oz (98.703 kg)    BMI SpO2            33.09 kg/m2          IMPRESSION: John Potter is a 80 year old man presenting to clinic in regards to his Stage IV carinoma of unknown origin.  PLAN: I spoke with the patient and his daughters today and the role of palliative radiation in Stage IV. At this point he does not have a symptomatic area which would respond to treatment. We discussed that he will proceed on with systemic treatment and may require palliative radiation in the future. He met with our physical therapist, Dr. Inda Merlin from medical oncology and our social worker. All vocalized questions and concerns were fully addressed. I will follow up with him on an as needed basis.   I spent 30 minutes  face to face with the patient and more than 50% of that time was spent in counseling and/or coordination of care.   This document serves as a record of services personally performed by Thea Silversmith , MD. It was created on her behalf by Lenn Cal, a trained medical scribe. The creation of this record is based on the scribe's personal observations and the provider's statements to them. This document has been checked and approved by the attending provider.   ------------------------------------------------  Thea Silversmith, MD

## 2014-08-26 ENCOUNTER — Telehealth: Payer: Self-pay | Admitting: Emergency Medicine

## 2014-08-26 ENCOUNTER — Telehealth: Payer: Self-pay | Admitting: *Deleted

## 2014-08-26 NOTE — Telephone Encounter (Signed)
Oncology Nurse Navigator Documentation  Oncology Nurse Navigator Flowsheets 08/26/2014  Navigator Encounter Type Other/I received an in basket from Dr. Lamonte Sakai.  Patient's tissue is still pending with a 4-5 day or analysis.  I updated Dr. Julien Nordmann.  He stated for me to cancel appt with him on 8/10 and to schedule after 4-5 days.  I called and spoke with John Potter, patient's daughter.  I explained cancellation of appt and to re-schedule after results of tissue are complete.    John Potter also stated patient can only eat soft or liquid foods.  She is concerned about him not getting enough nutrition.  Patient met with dietition on site but it is a struggle to get her dad to eat.  I stated I would update Dr. Julien Nordmann for any addition orders.    Patient Visit Type Follow-up  Treatment Phase Abnormal Scans  Barriers/Navigation Needs -  Interventions Coordination of Care  Coordination of Care -  Education Method -  Time Spent with Patient 30

## 2014-08-26 NOTE — Telephone Encounter (Signed)
I spoke with Dr Donato Heinz today. Multiple stains have been done, the lung biopsies are atypical, not consistent with thyroid CA, very unusual for a primary lung CA. Dr Clover Mealy plan is to send the slides off for over-read.   Phoned pt's daughter John Potter to give her latest info   He has a MRI Brain scheduled for 8/10, then OV with Dr Julien Nordmann to discuss treatment. They had hoped that more definitive tissue dx would be available by tomorrow. Now I suspect it will be 4-5 days. [name] asked whether John Potter still needed to come to Oncology on 8/10 or whether he should reschedule.   [name], I was hesitant to give him advice to reschedule, thought Dr Julien Nordmann may still want to see him since some path info is available. Also pt is still having anorexia, wt loss, etc. Would you check with MM and let them know if he still wants to see pt tomorrow? Thanks very much

## 2014-08-27 ENCOUNTER — Ambulatory Visit: Payer: Medicare Other | Admitting: Internal Medicine

## 2014-08-27 ENCOUNTER — Telehealth: Payer: Self-pay

## 2014-08-27 ENCOUNTER — Ambulatory Visit (HOSPITAL_COMMUNITY)
Admission: RE | Admit: 2014-08-27 | Discharge: 2014-08-27 | Disposition: A | Discharge status: Home / Self Care | Payer: Medicare Other | Source: Ambulatory Visit | Attending: Internal Medicine | Admitting: Internal Medicine

## 2014-08-27 ENCOUNTER — Encounter: Payer: Medicare Other | Admitting: Nutrition

## 2014-08-27 ENCOUNTER — Telehealth: Payer: Self-pay | Admitting: *Deleted

## 2014-08-27 DIAGNOSIS — I739 Peripheral vascular disease, unspecified: Secondary | ICD-10-CM | POA: Insufficient documentation

## 2014-08-27 DIAGNOSIS — C3490 Malignant neoplasm of unspecified part of unspecified bronchus or lung: Secondary | ICD-10-CM | POA: Diagnosis not present

## 2014-08-27 DIAGNOSIS — R262 Difficulty in walking, not elsewhere classified: Secondary | ICD-10-CM | POA: Diagnosis not present

## 2014-08-27 DIAGNOSIS — C3432 Malignant neoplasm of lower lobe, left bronchus or lung: Secondary | ICD-10-CM

## 2014-08-27 DIAGNOSIS — E46 Unspecified protein-calorie malnutrition: Secondary | ICD-10-CM

## 2014-08-27 DIAGNOSIS — R410 Disorientation, unspecified: Secondary | ICD-10-CM | POA: Diagnosis not present

## 2014-08-27 MED ORDER — GADOBENATE DIMEGLUMINE 529 MG/ML IV SOLN
20.0000 mL | Freq: Once | INTRAVENOUS | Status: AC | PRN
  Administered 2014-08-27: 20 mL via INTRAVENOUS

## 2014-08-27 NOTE — Telephone Encounter (Signed)
taja called to clarify today's appt is cancelled. She was asking if John Potter had spoken with Dr Julien Nordmann about her father's difficulty in swallowing.

## 2014-08-27 NOTE — Telephone Encounter (Signed)
Oncology Nurse Navigator Documentation  Oncology Nurse Navigator Flowsheets 08/27/2014  Navigator Encounter Type Other/I followed up with daughter regarding patient's difficulty swallowing.  John Potter stated that he is doing better today.  I told her I spoke with Dr. Julien Nordmann and he suggested either rad onc referral or PEG tube placement.  I discussed these options with her.  I listened to her as she explained patient's wishes.  They would like to hold off on referral and PEG tube at this time.  I instructed her on s/s of dehydration and if noted he needs to be seen.  She verbalized understanding.   I asked that she encourage her dad to drink a little throughout the day.  I stated when his pathology is back I will set John Potter up to see Dr. Julien Nordmann to go over pathology and treatment plan.   She was thankful for the call back.    Patient Visit Type Follow-up  Treatment Phase Abnormal Scans  Interventions Coordination of Care  Coordination of Care Other  Time Spent with Patient 30

## 2014-08-28 ENCOUNTER — Other Ambulatory Visit: Payer: Self-pay | Admitting: Internal Medicine

## 2014-08-28 LAB — FUNGUS CULTURE W SMEAR: Fungal Smear: NONE SEEN

## 2014-08-29 ENCOUNTER — Encounter: Payer: Self-pay | Admitting: *Deleted

## 2014-08-29 ENCOUNTER — Encounter (HOSPITAL_COMMUNITY): Payer: Self-pay

## 2014-08-29 ENCOUNTER — Telehealth: Payer: Self-pay | Admitting: Cardiovascular Disease

## 2014-08-29 MED ORDER — LISINOPRIL 5 MG PO TABS: 5.0000 mg | ORAL_TABLET | Freq: Every day | ORAL | Status: AC

## 2014-08-29 NOTE — Progress Notes (Signed)
[place] Work  Clinical Social Work received vopicemail from patient's daughter, [name].  Patient's daughter left message requesting palliative care support.  CSW contacted patient's daughter by phone.  [name] shared her father has not been doing well, is only consuming liquids and she is concerned about his overall health.  She had questions regarding if patient is eligible for outpatient palliative care services.  CSW explained concept of palliative care and palliative care being integrated in all physician's treatment visits.  Patient's daughter was interested in [facility] palliative care program- CSW notified Norton Blizzard to discuss with medical oncologist and refer if appropriate.  Polo Riley, MSW, LCSW, OSW-C Clinical Social Worker [facility] [phone]

## 2014-08-29 NOTE — Progress Notes (Signed)
Oncology Nurse Navigator Documentation  Oncology Nurse Navigator Flowsheets 08/29/2014  Navigator Encounter Type Other/I received a call from Polo Riley, [place] about patient's daughter calling to inquire about palliative care consult.  I notified Dr. Julien Nordmann on consult and will wait to hear from him on further action.    Patient Visit Type Follow-up  Treatment Phase -  Barriers/Navigation Needs -  Interventions Other;Coordination of Care  Coordination of Care Other  Education Method -  Time Spent with Patient 15

## 2014-08-29 NOTE — Telephone Encounter (Signed)
Patient was taking Lisinopril 40 mg and when he was in the hospital the dosage was reduced to 5 mg.  The hospitalist wrote the original prescription for the 5 mg.  [facility] [address] needs [place] to authorize the refill.

## 2014-08-29 NOTE — Telephone Encounter (Signed)
Rx(s) sent to pharmacy electronically.  

## 2014-09-01 ENCOUNTER — Encounter: Payer: Self-pay | Admitting: *Deleted

## 2014-09-01 ENCOUNTER — Ambulatory Visit: Payer: Medicare Other

## 2014-09-01 ENCOUNTER — Ambulatory Visit (HOSPITAL_BASED_OUTPATIENT_CLINIC_OR_DEPARTMENT_OTHER): Payer: Medicare Other | Admitting: Internal Medicine

## 2014-09-01 ENCOUNTER — Encounter (HOSPITAL_COMMUNITY): Payer: Self-pay | Admitting: Internal Medicine

## 2014-09-01 ENCOUNTER — Encounter: Payer: Self-pay | Admitting: Internal Medicine

## 2014-09-01 ENCOUNTER — Inpatient Hospital Stay (HOSPITAL_COMMUNITY)
Admission: EM | Admit: 2014-09-01 | Discharge: 2014-09-07 | DRG: 871 | Disposition: A | Discharge status: Skilled Nursing Facility | Payer: Medicare Other | Attending: Family Medicine | Admitting: Family Medicine

## 2014-09-01 ENCOUNTER — Other Ambulatory Visit: Payer: Self-pay | Admitting: *Deleted

## 2014-09-01 ENCOUNTER — Emergency Department (HOSPITAL_COMMUNITY): Payer: Medicare Other

## 2014-09-01 ENCOUNTER — Telehealth: Payer: Self-pay | Admitting: *Deleted

## 2014-09-01 ENCOUNTER — Telehealth: Payer: Self-pay | Admitting: Medical Oncology

## 2014-09-01 ENCOUNTER — Other Ambulatory Visit: Payer: Self-pay

## 2014-09-01 ENCOUNTER — Ambulatory Visit (HOSPITAL_BASED_OUTPATIENT_CLINIC_OR_DEPARTMENT_OTHER): Payer: Medicare Other

## 2014-09-01 ENCOUNTER — Encounter (HOSPITAL_COMMUNITY): Payer: Self-pay | Admitting: Emergency Medicine

## 2014-09-01 VITALS — BP 91/30 | HR 67 | Temp 98.0°F | Resp 16 | Ht 68.0 in

## 2014-09-01 DIAGNOSIS — Z79899 Other long term (current) drug therapy: Secondary | ICD-10-CM | POA: Diagnosis not present

## 2014-09-01 DIAGNOSIS — A419 Sepsis, unspecified organism: Secondary | ICD-10-CM | POA: Diagnosis not present

## 2014-09-01 DIAGNOSIS — Z809 Family history of malignant neoplasm, unspecified: Secondary | ICD-10-CM | POA: Diagnosis not present

## 2014-09-01 DIAGNOSIS — E46 Unspecified protein-calorie malnutrition: Secondary | ICD-10-CM | POA: Diagnosis present

## 2014-09-01 DIAGNOSIS — R531 Weakness: Secondary | ICD-10-CM

## 2014-09-01 DIAGNOSIS — R5383 Other fatigue: Secondary | ICD-10-CM

## 2014-09-01 DIAGNOSIS — C3492 Malignant neoplasm of unspecified part of left bronchus or lung: Secondary | ICD-10-CM

## 2014-09-01 DIAGNOSIS — Z961 Presence of intraocular lens: Secondary | ICD-10-CM | POA: Diagnosis present

## 2014-09-01 DIAGNOSIS — Z880 Allergy status to penicillin: Secondary | ICD-10-CM

## 2014-09-01 DIAGNOSIS — E860 Dehydration: Secondary | ICD-10-CM

## 2014-09-01 DIAGNOSIS — R0602 Shortness of breath: Secondary | ICD-10-CM

## 2014-09-01 DIAGNOSIS — Z833 Family history of diabetes mellitus: Secondary | ICD-10-CM

## 2014-09-01 DIAGNOSIS — I959 Hypotension, unspecified: Secondary | ICD-10-CM | POA: Diagnosis not present

## 2014-09-01 DIAGNOSIS — L8990 Pressure ulcer of unspecified site, unspecified stage: Secondary | ICD-10-CM | POA: Insufficient documentation

## 2014-09-01 DIAGNOSIS — L8991 Pressure ulcer of unspecified site, stage 1: Secondary | ICD-10-CM | POA: Diagnosis present

## 2014-09-01 DIAGNOSIS — J90 Pleural effusion, not elsewhere classified: Secondary | ICD-10-CM | POA: Diagnosis present

## 2014-09-01 DIAGNOSIS — C3432 Malignant neoplasm of lower lobe, left bronchus or lung: Secondary | ICD-10-CM

## 2014-09-01 DIAGNOSIS — Z8553 Personal history of malignant neoplasm of renal pelvis: Secondary | ICD-10-CM | POA: Diagnosis not present

## 2014-09-01 DIAGNOSIS — C786 Secondary malignant neoplasm of retroperitoneum and peritoneum: Secondary | ICD-10-CM | POA: Diagnosis not present

## 2014-09-01 DIAGNOSIS — Z8249 Family history of ischemic heart disease and other diseases of the circulatory system: Secondary | ICD-10-CM | POA: Diagnosis not present

## 2014-09-01 DIAGNOSIS — I350 Nonrheumatic aortic (valve) stenosis: Secondary | ICD-10-CM | POA: Diagnosis not present

## 2014-09-01 DIAGNOSIS — J9601 Acute respiratory failure with hypoxia: Secondary | ICD-10-CM | POA: Diagnosis not present

## 2014-09-01 DIAGNOSIS — Z9842 Cataract extraction status, left eye: Secondary | ICD-10-CM

## 2014-09-01 DIAGNOSIS — Z66 Do not resuscitate: Secondary | ICD-10-CM | POA: Diagnosis present

## 2014-09-01 DIAGNOSIS — I447 Left bundle-branch block, unspecified: Secondary | ICD-10-CM | POA: Diagnosis not present

## 2014-09-01 DIAGNOSIS — Z6830 Body mass index (BMI) 30.0-30.9, adult: Secondary | ICD-10-CM

## 2014-09-01 DIAGNOSIS — R1311 Dysphagia, oral phase: Secondary | ICD-10-CM | POA: Diagnosis not present

## 2014-09-01 DIAGNOSIS — Z87891 Personal history of nicotine dependence: Secondary | ICD-10-CM

## 2014-09-01 DIAGNOSIS — R918 Other nonspecific abnormal finding of lung field: Secondary | ICD-10-CM | POA: Diagnosis present

## 2014-09-01 DIAGNOSIS — R509 Fever, unspecified: Secondary | ICD-10-CM | POA: Diagnosis not present

## 2014-09-01 DIAGNOSIS — M6281 Muscle weakness (generalized): Secondary | ICD-10-CM | POA: Diagnosis not present

## 2014-09-01 DIAGNOSIS — E1122 Type 2 diabetes mellitus with diabetic chronic kidney disease: Secondary | ICD-10-CM | POA: Diagnosis present

## 2014-09-01 DIAGNOSIS — R609 Edema, unspecified: Secondary | ICD-10-CM

## 2014-09-01 DIAGNOSIS — E440 Moderate protein-calorie malnutrition: Secondary | ICD-10-CM | POA: Diagnosis not present

## 2014-09-01 DIAGNOSIS — D638 Anemia in other chronic diseases classified elsewhere: Secondary | ICD-10-CM | POA: Diagnosis not present

## 2014-09-01 DIAGNOSIS — R278 Other lack of coordination: Secondary | ICD-10-CM | POA: Diagnosis not present

## 2014-09-01 DIAGNOSIS — I509 Heart failure, unspecified: Secondary | ICD-10-CM | POA: Diagnosis present

## 2014-09-01 DIAGNOSIS — I129 Hypertensive chronic kidney disease with stage 1 through stage 4 chronic kidney disease, or unspecified chronic kidney disease: Secondary | ICD-10-CM | POA: Diagnosis present

## 2014-09-01 DIAGNOSIS — E43 Unspecified severe protein-calorie malnutrition: Secondary | ICD-10-CM | POA: Diagnosis not present

## 2014-09-01 DIAGNOSIS — Z9841 Cataract extraction status, right eye: Secondary | ICD-10-CM | POA: Diagnosis not present

## 2014-09-01 DIAGNOSIS — Z951 Presence of aortocoronary bypass graft: Secondary | ICD-10-CM | POA: Diagnosis not present

## 2014-09-01 DIAGNOSIS — C3490 Malignant neoplasm of unspecified part of unspecified bronchus or lung: Secondary | ICD-10-CM | POA: Diagnosis not present

## 2014-09-01 DIAGNOSIS — D649 Anemia, unspecified: Secondary | ICD-10-CM | POA: Diagnosis not present

## 2014-09-01 DIAGNOSIS — Z7982 Long term (current) use of aspirin: Secondary | ICD-10-CM | POA: Diagnosis not present

## 2014-09-01 DIAGNOSIS — G9340 Encephalopathy, unspecified: Secondary | ICD-10-CM | POA: Diagnosis not present

## 2014-09-01 DIAGNOSIS — Y95 Nosocomial condition: Secondary | ICD-10-CM | POA: Diagnosis present

## 2014-09-01 DIAGNOSIS — I10 Essential (primary) hypertension: Secondary | ICD-10-CM | POA: Diagnosis not present

## 2014-09-01 DIAGNOSIS — Z85528 Personal history of other malignant neoplasm of kidney: Secondary | ICD-10-CM | POA: Diagnosis not present

## 2014-09-01 DIAGNOSIS — M109 Gout, unspecified: Secondary | ICD-10-CM | POA: Diagnosis present

## 2014-09-01 DIAGNOSIS — N183 Chronic kidney disease, stage 3 unspecified: Secondary | ICD-10-CM | POA: Diagnosis present

## 2014-09-01 DIAGNOSIS — J189 Pneumonia, unspecified organism: Secondary | ICD-10-CM | POA: Diagnosis not present

## 2014-09-01 DIAGNOSIS — I2510 Atherosclerotic heart disease of native coronary artery without angina pectoris: Secondary | ICD-10-CM | POA: Diagnosis not present

## 2014-09-01 DIAGNOSIS — C7951 Secondary malignant neoplasm of bone: Secondary | ICD-10-CM | POA: Diagnosis not present

## 2014-09-01 DIAGNOSIS — R630 Anorexia: Secondary | ICD-10-CM | POA: Diagnosis not present

## 2014-09-01 DIAGNOSIS — N184 Chronic kidney disease, stage 4 (severe): Secondary | ICD-10-CM | POA: Diagnosis not present

## 2014-09-01 DIAGNOSIS — E785 Hyperlipidemia, unspecified: Secondary | ICD-10-CM | POA: Diagnosis not present

## 2014-09-01 DIAGNOSIS — Z905 Acquired absence of kidney: Secondary | ICD-10-CM | POA: Diagnosis present

## 2014-09-01 DIAGNOSIS — N400 Enlarged prostate without lower urinary tract symptoms: Secondary | ICD-10-CM | POA: Diagnosis present

## 2014-09-01 DIAGNOSIS — R590 Localized enlarged lymph nodes: Secondary | ICD-10-CM | POA: Diagnosis present

## 2014-09-01 DIAGNOSIS — R410 Disorientation, unspecified: Secondary | ICD-10-CM | POA: Diagnosis present

## 2014-09-01 LAB — I-STAT CG4 LACTIC ACID, ED: LACTIC ACID, VENOUS: 2.65 mmol/L — AB (ref 0.5–2.0)

## 2014-09-01 LAB — PROTIME-INR
INR: 1.44 (ref 0.00–1.49)
PROTHROMBIN TIME: 17.6 s — AB (ref 11.6–15.2)

## 2014-09-01 LAB — COMPREHENSIVE METABOLIC PANEL
ALT: 55 U/L (ref 17–63)
ANION GAP: 5 (ref 5–15)
AST: 62 U/L — ABNORMAL HIGH (ref 15–41)
Albumin: 2.2 g/dL — ABNORMAL LOW (ref 3.5–5.0)
Alkaline Phosphatase: 73 U/L (ref 38–126)
BUN: 51 mg/dL — ABNORMAL HIGH (ref 6–20)
CHLORIDE: 104 mmol/L (ref 101–111)
CO2: 30 mmol/L (ref 22–32)
CREATININE: 1.65 mg/dL — AB (ref 0.61–1.24)
Calcium: 12.7 mg/dL — ABNORMAL HIGH (ref 8.9–10.3)
GFR, EST AFRICAN AMERICAN: 44 mL/min — AB (ref >60)
GFR, EST NON AFRICAN AMERICAN: 38 mL/min — AB (ref >60)
Glucose, Bld: 121 mg/dL — ABNORMAL HIGH (ref 65–99)
POTASSIUM: 5.1 mmol/L (ref 3.5–5.1)
SODIUM: 139 mmol/L (ref 135–145)
Total Bilirubin: 0.5 mg/dL (ref 0.3–1.2)
Total Protein: 6.2 g/dL — ABNORMAL LOW (ref 6.5–8.1)

## 2014-09-01 LAB — URINALYSIS, ROUTINE W REFLEX MICROSCOPIC
BILIRUBIN URINE: NEGATIVE
GLUCOSE, UA: NEGATIVE mg/dL
Hgb urine dipstick: NEGATIVE
Ketones, ur: NEGATIVE mg/dL
NITRITE: NEGATIVE
PH: 5 (ref 5.0–8.0)
Protein, ur: NEGATIVE mg/dL
SPECIFIC GRAVITY, URINE: 1.02 (ref 1.005–1.030)
Urobilinogen, UA: 1 mg/dL (ref 0.0–1.0)

## 2014-09-01 LAB — CBC WITH DIFFERENTIAL/PLATELET
BASO%: 0.3 % (ref 0.0–2.0)
BASOS ABS: 0.1 10*3/uL (ref 0.0–0.1)
Basophils Absolute: 0 10*3/uL (ref 0.0–0.1)
Basophils Relative: 0 % (ref 0–1)
EOS ABS: 0.1 10*3/uL (ref 0.0–0.5)
EOS ABS: 0.1 10*3/uL (ref 0.0–0.7)
EOS PCT: 1 % (ref 0–5)
EOS%: 0.9 % (ref 0.0–7.0)
HCT: 30.6 % — ABNORMAL LOW (ref 39.0–52.0)
HEMATOCRIT: 30.9 % — AB (ref 38.4–49.9)
HEMOGLOBIN: 9.4 g/dL — AB (ref 13.0–17.1)
Hemoglobin: 9 g/dL — ABNORMAL LOW (ref 13.0–17.0)
LYMPH#: 1.3 10*3/uL (ref 0.9–3.3)
LYMPH%: 8 % — ABNORMAL LOW (ref 14.0–49.0)
LYMPHS ABS: 1.4 10*3/uL (ref 0.7–4.0)
LYMPHS PCT: 9 % — AB (ref 12–46)
MCH: 25.7 pg — ABNORMAL LOW (ref 27.2–33.4)
MCH: 25.2 pg — AB (ref 26.0–34.0)
MCHC: 30.4 g/dL — ABNORMAL LOW (ref 32.0–36.0)
MCHC: 29.4 g/dL — AB (ref 30.0–36.0)
MCV: 84.4 fL (ref 79.3–98.0)
MCV: 85.7 fL (ref 78.0–100.0)
MONO ABS: 1.3 10*3/uL — AB (ref 0.1–1.0)
MONO#: 1.6 10*3/uL — AB (ref 0.1–0.9)
MONO%: 9.8 % (ref 0.0–14.0)
Monocytes Relative: 8 % (ref 3–12)
NEUT%: 81 % — ABNORMAL HIGH (ref 39.0–75.0)
NEUTROS ABS: 12.9 10*3/uL — AB (ref 1.5–6.5)
NRBC: 0 % (ref 0–0)
Neutro Abs: 12.3 10*3/uL — ABNORMAL HIGH (ref 1.7–7.7)
Neutrophils Relative %: 82 % — ABNORMAL HIGH (ref 43–77)
PLATELETS: 305 10*3/uL (ref 140–400)
PLATELETS: 284 10*3/uL (ref 150–400)
RBC: 3.66 10*6/uL — ABNORMAL LOW (ref 4.20–5.82)
RBC: 3.57 MIL/uL — ABNORMAL LOW (ref 4.22–5.81)
RDW: 16.4 % — AB (ref 11.0–14.6)
RDW: 16.3 % — AB (ref 11.5–15.5)
WBC: 16 10*3/uL — ABNORMAL HIGH (ref 4.0–10.3)
WBC: 15 10*3/uL — AB (ref 4.0–10.5)

## 2014-09-01 LAB — LACTIC ACID, PLASMA: LACTIC ACID, VENOUS: 1.9 mmol/L (ref 0.5–2.0)

## 2014-09-01 LAB — GLUCOSE, CAPILLARY: Glucose-Capillary: 109 mg/dL — ABNORMAL HIGH (ref 65–99)

## 2014-09-01 LAB — URINE MICROSCOPIC-ADD ON

## 2014-09-01 LAB — COMPREHENSIVE METABOLIC PANEL (CC13)
ALBUMIN: 1.9 g/dL — AB (ref 3.5–5.0)
ALK PHOS: 84 U/L (ref 40–150)
ALT: 61 U/L — AB (ref 0–55)
ANION GAP: 10 meq/L (ref 3–11)
AST: 60 U/L — ABNORMAL HIGH (ref 5–34)
BILIRUBIN TOTAL: 0.35 mg/dL (ref 0.20–1.20)
BUN: 50 mg/dL — ABNORMAL HIGH (ref 7.0–26.0)
CO2: 29 mEq/L (ref 22–29)
CREATININE: 1.6 mg/dL — AB (ref 0.7–1.3)
Calcium: 14 mg/dL (ref 8.4–10.4)
Chloride: 103 mEq/L (ref 98–109)
EGFR: 40 mL/min/{1.73_m2} — AB (ref >90)
Glucose: 120 mg/dl (ref 70–140)
Potassium: 5 mEq/L (ref 3.5–5.1)
Sodium: 142 mEq/L (ref 136–145)
TOTAL PROTEIN: 6.2 g/dL — AB (ref 6.4–8.3)

## 2014-09-01 LAB — I-STAT TROPONIN, ED: Troponin i, poc: 0.05 ng/mL (ref 0.00–0.08)

## 2014-09-01 LAB — LIPASE, BLOOD: Lipase: 42 U/L (ref 22–51)

## 2014-09-01 LAB — APTT: APTT: 32 s (ref 24–37)

## 2014-09-01 LAB — PROCALCITONIN: Procalcitonin: 0.15 ng/mL

## 2014-09-01 MED ORDER — PROCHLORPERAZINE MALEATE 10 MG PO TABS
10.0000 mg | ORAL_TABLET | Freq: Four times a day (QID) | ORAL | Status: AC | PRN
Start: 2014-09-01 — End: ?

## 2014-09-01 MED ORDER — SIMVASTATIN 20 MG PO TABS
20.0000 mg | ORAL_TABLET | Freq: Every day | ORAL | Status: DC
  Administered 2014-09-01 – 2014-09-06 (×6): 20 mg via ORAL
  Filled 2014-09-01 (×6): qty 1

## 2014-09-01 MED ORDER — VANCOMYCIN HCL IN DEXTROSE 750-5 MG/150ML-% IV SOLN
750.0000 mg | Freq: Two times a day (BID) | INTRAVENOUS | Status: DC
  Administered 2014-09-02 – 2014-09-03 (×3): 750 mg via INTRAVENOUS
  Filled 2014-09-01 (×4): qty 150

## 2014-09-01 MED ORDER — INSULIN ASPART 100 UNIT/ML ~~LOC~~ SOLN
0.0000 [IU] | Freq: Every day | SUBCUTANEOUS | Status: DC
  Administered 2014-09-04: 2 [IU] via SUBCUTANEOUS

## 2014-09-01 MED ORDER — SODIUM CHLORIDE 0.9 % IV SOLN
90.0000 mg | Freq: Once | INTRAVENOUS | Status: AC
  Administered 2014-09-01: 90 mg via INTRAVENOUS
  Filled 2014-09-01: qty 10

## 2014-09-01 MED ORDER — SODIUM CHLORIDE 0.9 % IV SOLN: INTRAVENOUS | Status: DC

## 2014-09-01 MED ORDER — INSULIN ASPART 100 UNIT/ML ~~LOC~~ SOLN
0.0000 [IU] | Freq: Three times a day (TID) | SUBCUTANEOUS | Status: DC
  Administered 2014-09-03 – 2014-09-04 (×4): 2 [IU] via SUBCUTANEOUS
  Administered 2014-09-04 – 2014-09-06 (×3): 1 [IU] via SUBCUTANEOUS
  Administered 2014-09-06: 2 [IU] via SUBCUTANEOUS
  Administered 2014-09-06 – 2014-09-07 (×2): 1 [IU] via SUBCUTANEOUS

## 2014-09-01 MED ORDER — TAMSULOSIN HCL 0.4 MG PO CAPS
0.4000 mg | ORAL_CAPSULE | Freq: Every day | ORAL | Status: DC
  Administered 2014-09-01 – 2014-09-07 (×7): 0.4 mg via ORAL
  Filled 2014-09-01 (×7): qty 1

## 2014-09-01 MED ORDER — VANCOMYCIN HCL 10 G IV SOLR
1500.0000 mg | Freq: Once | INTRAVENOUS | Status: AC
  Administered 2014-09-01: 1500 mg via INTRAVENOUS
  Filled 2014-09-01: qty 1500

## 2014-09-01 MED ORDER — CALCITONIN (SALMON) 200 UNIT/ML IJ SOLN
400.0000 [IU] | Freq: Two times a day (BID) | INTRAMUSCULAR | Status: DC
  Administered 2014-09-01 – 2014-09-07 (×12): 400 [IU] via SUBCUTANEOUS
  Filled 2014-09-01 (×18): qty 2

## 2014-09-01 MED ORDER — SODIUM CHLORIDE 0.9 % IV SOLN
250.0000 mg | Freq: Four times a day (QID) | INTRAVENOUS | Status: DC
  Administered 2014-09-02: 250 mg via INTRAVENOUS
  Filled 2014-09-01 (×3): qty 250

## 2014-09-01 MED ORDER — ASPIRIN EC 81 MG PO TBEC
81.0000 mg | DELAYED_RELEASE_TABLET | Freq: Every evening | ORAL | Status: DC
  Administered 2014-09-01 – 2014-09-06 (×6): 81 mg via ORAL
  Filled 2014-09-01 (×7): qty 1

## 2014-09-01 MED ORDER — ENOXAPARIN SODIUM 30 MG/0.3ML ~~LOC~~ SOLN
30.0000 mg | SUBCUTANEOUS | Status: DC
  Administered 2014-09-01: 30 mg via SUBCUTANEOUS
  Filled 2014-09-01: qty 0.3

## 2014-09-01 MED ORDER — SODIUM CHLORIDE 0.9 % IV SOLN
500.0000 mg | Freq: Once | INTRAVENOUS | Status: AC
  Administered 2014-09-01: 500 mg via INTRAVENOUS
  Filled 2014-09-01: qty 500

## 2014-09-01 MED ORDER — SODIUM CHLORIDE 0.9 % IV BOLUS (SEPSIS)
1000.0000 mL | Freq: Once | INTRAVENOUS | Status: AC
  Administered 2014-09-01: 1000 mL via INTRAVENOUS

## 2014-09-01 MED ORDER — SODIUM CHLORIDE 0.9 % IV SOLN
INTRAVENOUS | Status: DC
  Administered 2014-09-01 – 2014-09-03 (×3): via INTRAVENOUS

## 2014-09-01 NOTE — Progress Notes (Addendum)
ANTIBIOTIC CONSULT NOTE - INITIAL  Pharmacy Consult for Vancomycin, Primaxin Indication: postobstructive PNA, sepsis  Allergies  Allergen Reactions  . Amoxicillin     rash    Patient Measurements: Height: '5\' 9"'$  (175.3 cm) Weight: 208 lb 1.8 oz (94.4 kg) IBW/kg (Calculated) : 70.7  Vital Signs: Temp: 97.8 F (36.6 C) (08/15 1959) Temp Source: Oral (08/15 1959) BP: 133/54 mmHg (08/15 1959) Pulse Rate: 78 (08/15 1959) Intake/Output from previous day:   Intake/Output from this shift:    Labs:  Recent Labs  09/01/14 1432  WBC 16.0*  HGB 9.4*  PLT 305  CREATININE 1.6*   Estimated Creatinine Clearance: 43.2 mL/min (by C-G formula based on Cr of 1.6). No results for input(s): VANCOTROUGH, VANCOPEAK, VANCORANDOM, GENTTROUGH, GENTPEAK, GENTRANDOM, TOBRATROUGH, TOBRAPEAK, TOBRARND, AMIKACINPEAK, AMIKACINTROU, AMIKACIN in the last 72 hours.   Microbiology: No results found for this or any previous visit (from the past 720 hour(s)).  Medical History: Past Medical History  Diagnosis Date  . CAD (coronary artery disease)     sees Dr. Dani Gobble Croitoru   . Hypertension   . Hyperlipidemia   . Rheumatic fever "I was an infant"  . Hyperglycemia   . BPH (benign prostatic hyperplasia)     sees Dr. Junious Silk   . Heart murmur dx'd 1958  . Type II diabetes mellitus dx'd ~ 04/2014    "mild; advised by MD to lose weight; no RX" (07/30/2014)  . History of gout   . Kidney carcinoma 1999    sees Dr. Junious Silk   . Malnutrition related to chronic disease 08/21/2014    Medications:  Anti-infectives    None     Assessment: 80 y.o. male with stage IV lung cancer about to start chemo, admitted 09/01/2014 from oncologist's office for hypercalcemia.  Reports chills but no fever; meets sepsis criteria with CXR concerning for postobstructive pneumonia.  Pharmacy to dose vancomycin and Primaxin (given rash to Amox and desired anaerobic coverage)   Goal of Therapy:  Vancomycin trough level  15-20 mcg/ml  Eradication of infection Appropriate antibiotic dosing for indication and renal function  Plan:  Day 1 antibiotics Vancomycin 1500 mg IV now, then 750 mg IV q12 hr Measure vancomycin trough levels at steady state as indicated Primaxin 500 mg IV now, then 250 mg IV q6 hr  Follow clinical course, renal function, culture results as available  Follow for de-escalation of antibiotics and LOT   Reuel Boom, PharmD, BCPS Pager: [phone] 09/01/2014, 9:29 PM

## 2014-09-01 NOTE — ED Notes (Signed)
Bed: XQ82 Expected date:  Expected time:  Means of arrival:  Comments: Ca ctr pt

## 2014-09-01 NOTE — ED Notes (Signed)
19:30 pt can go to floor.

## 2014-09-01 NOTE — Telephone Encounter (Signed)
PT TRANSPORTED TO ED IN [facility].

## 2014-09-01 NOTE — Telephone Encounter (Signed)
Oncology Nurse Navigator Documentation  Oncology Nurse Navigator Flowsheets 09/01/2014  Navigator Encounter Type Other/I looked to see if pathology was back and it was.  I notified Dr. Julien Nordmann and he asked me to arrange an appt for patient to be seen today.  I called and spoke with daughter [name].  I updated her that her dad's pathology is back and Dr. Julien Nordmann would like to see her today. She is aware of appt today at 3:00 labs and 3:15 see dr. Julien Nordmann  Patient Visit Type Follow-up  Interventions Coordination of Care  Time Spent with Patient 30

## 2014-09-01 NOTE — Progress Notes (Signed)
Oncology Nurse Navigator Documentation  Oncology Nurse Navigator Flowsheets 09/01/2014  Navigator Encounter Type Other/spoke with patient and daughters today.  He is not feeling well and is going to ED.  Daughter gave me healthcare power of attorney papers.  I gave a copy to HIM to scan into EMR.    Patient Visit Type Medonc  Treatment Phase Other  Barriers/Navigation Needs Education  Interventions Other  Coordination of Care -  Education Method -  Time Spent with Patient 30

## 2014-09-01 NOTE — ED Notes (Signed)
Critical values given to Dr. Oleta Mouse

## 2014-09-01 NOTE — Progress Notes (Addendum)
[facility] [phone]   [phone]  OFFICE PROGRESS NOTE  Laurey Morale, MD [place] [address]  DIAGNOSIS: Stage IV (T4, N3, M1b) non-small cell lung cancer, sarcomatoid carcinoma diagnosed in August 2016 presented with large left lower lobe lung mass, lymphangitic spread of tumor as well as mediastinal lymphadenopathy and metastatic disease to the peritonium and bones.  PRIOR THERAPY: None  CURRENT THERAPY: Systemic chemotherapy with carboplatin for AUC of 5 and paclitaxel 175 MG/M2 every 3 weeks with Neulasta support. First dose 09/10/2014.  INTERVAL HISTORY: John Potter 80 y.o. male returns to the clinic today for follow-up visit accompanied by his 2 daughters. The patient continues to have significant fatigue and weakness. He also has lack of appetite and was not eating or drinking enough over the last few days. His daughters have hard time taking care of him at home and especially with transportation to and back from the clinic. The final pathology became available and it was consistent with sarcomatoid carcinoma of the lung. There are here today for evaluation and discussion of his treatment options. The patient continues to have shortness of breath with mild cough and left-sided chest pain but no hemoptysis. He has no fever or chills, no nausea or vomiting.  MEDICAL HISTORY: Past Medical History  Diagnosis Date  . CAD (coronary artery disease)     sees Dr. Dani Gobble [place]   . Hypertension   . Hyperlipidemia   . Rheumatic fever "I was an infant"  . Hyperglycemia   . BPH (benign prostatic hyperplasia)     sees Dr. Junious Silk   . Heart murmur dx'd 1958  . Type II diabetes mellitus dx'd ~ 04/2014    "mild; advised by MD to lose weight; no RX" (07/30/2014)  . History of gout   . Kidney carcinoma 1999    sees Dr. Junious Silk   . Malnutrition related to chronic disease 08/21/2014    ALLERGIES:  is allergic to  amoxicillin.  MEDICATIONS:  Current Outpatient Prescriptions  Medication Sig Dispense Refill  . aspirin EC 81 MG tablet Take 81 mg by mouth every evening.    . Calcium Carb-Cholecalciferol (CALCIUM 600 + D) 600-200 MG-UNIT TABS Take 1 tablet by mouth daily.    . ferrous sulfate 325 (65 FE) MG tablet TAKE 1 TABLET BY MOUTH ONCE DAILY WITH BREAKFAST 30 tablet 11  . fish oil-omega-3 fatty acids 1000 MG capsule Take 2 g by mouth daily.      . furosemide (LASIX) 40 MG tablet Take 1 tablet (40 mg total) by mouth 3 (three) times a week. 90 tablet 3  . hydrocortisone 2.5 % cream Apply topically 2 (two) times daily. 30 g 0  . lisinopril (PRINIVIL,ZESTRIL) 5 MG tablet Take 1 tablet (5 mg total) by mouth daily. 30 tablet 5  . methylPREDNISolone (MEDROL DOSEPAK) 4 MG TBPK tablet Use as instructed 21 tablet 0  . metoprolol tartrate (LOPRESSOR) 25 MG tablet Take 25 mg by mouth 2 (two) times daily.      . Multiple Vitamin (MULTIVITAMIN) tablet Take 1 tablet by mouth daily.      . Multiple Vitamins-Minerals (PRESERVISION/LUTEIN) CAPS Take 2 capsules by mouth daily.     . simvastatin (ZOCOR) 20 MG tablet Take 20 mg by mouth at bedtime.      . Tamsulosin HCl (FLOMAX) 0.4 MG CAPS Take 0.4 mg by mouth daily.    . vitamin C (ASCORBIC ACID) 500 MG tablet Take  1,000 mg by mouth daily.      No current facility-administered medications for this visit.    SURGICAL HISTORY:  Past Surgical History  Procedure Laterality Date  . Coronary artery bypass graft  1991    CABG X 3; per Dr. Redmond Pulling   . Nephrectomy Left 1999     per Dr. Hessie Diener   . Vasectomy    . Tonsillectomy and adenoidectomy    . Colonoscopy  2010    clear, repeat in 10 yrs   . US echocardiography  07/14/2009    mod-severe LVH,EF =>55%,LA mildly dilated,mild mitral & aortic annular ca+, AOV mod. sclerotic,discrete nodular thickening of the non-coronary cusp  . Cataract extraction w/ intraocular lens  implant, bilateral Bilateral 2000's  .  Cardiac catheterization  1991  . Video bronchoscopy Bilateral 08/04/2014    Procedure: VIDEO BRONCHOSCOPY WITH FLUORO;  Surgeon: Collene Gobble, MD;  Location: [facility];  Service: Cardiopulmonary;  Laterality: Bilateral;    REVIEW OF SYSTEMS:  Constitutional: positive for anorexia, fatigue and weight loss Eyes: negative Ears, nose, mouth, throat, and face: negative Respiratory: positive for cough, dyspnea on exertion and pleurisy/chest pain Cardiovascular: negative Gastrointestinal: negative Genitourinary:negative Integument/breast: negative Hematologic/lymphatic: negative Musculoskeletal:positive for muscle weakness Neurological: negative Behavioral/Psych: negative Endocrine: negative Allergic/Immunologic: negative   PHYSICAL EXAMINATION: General appearance: alert, cooperative, fatigued and no distress Head: Normocephalic, without obvious abnormality, atraumatic Neck: no adenopathy, no JVD, supple, symmetrical, trachea midline and thyroid not enlarged, symmetric, no tenderness/mass/nodules Lymph nodes: Cervical, supraclavicular, and axillary nodes normal. Resp: clear to auscultation bilaterally Back: symmetric, no curvature. ROM normal. No CVA tenderness. Cardio: regular rate and rhythm, S1, S2 normal, no murmur, click, rub or gallop GI: soft, non-tender; bowel sounds normal; no masses,  no organomegaly Extremities: extremities normal, atraumatic, no cyanosis or edema Neurologic: Alert and oriented X 3, normal strength and tone. Normal symmetric reflexes. Normal coordination and gait  ECOG PERFORMANCE STATUS: 2 - Symptomatic, <50% confined to bed  Blood pressure 91/30, pulse 67, temperature 98 F (36.7 C), temperature source Oral, resp. rate 16, height '5\' 8"'$  (1.727 m), SpO2 98 %.  LABORATORY DATA: Lab Results  Component Value Date   WBC 16.0* 09/01/2014   HGB 9.4* 09/01/2014   HCT 30.9* 09/01/2014   MCV 84.4 09/01/2014   PLT 305 09/01/2014      Chemistry        Component Value Date/Time   NA 142 09/01/2014 1432   NA 137 08/01/2014 0403   K 5.0 09/01/2014 1432   K 4.4 08/01/2014 0403   CL 103 08/01/2014 0403   CO2 29 09/01/2014 1432   CO2 26 08/01/2014 0403   BUN 50.0* 09/01/2014 1432   BUN 35* 08/01/2014 0403   CREATININE 1.6* 09/01/2014 1432   CREATININE 1.40* 08/01/2014 0403   CREATININE 1.51* 07/07/2014 1121      Component Value Date/Time   CALCIUM 14.0 Repeated and Verified* 09/01/2014 1432   CALCIUM 9.7 08/01/2014 0403   ALKPHOS 84 09/01/2014 1432   ALKPHOS 49 07/31/2014 0808   AST 60* 09/01/2014 1432   AST 30 07/31/2014 0808   ALT 61* 09/01/2014 1432   ALT 28 07/31/2014 0808   BILITOT 0.35 09/01/2014 1432   BILITOT 0.3 07/31/2014 0808       RADIOGRAPHIC STUDIES: Dg Chest 1 View  08/19/2014   CLINICAL DATA:  Status post biopsy left lower lobe mass  EXAM: CHEST  1 VIEW  COMPARISON:  Chest radiograph August 04, 2014; chest CT July 31, 2014  FINDINGS: No pneumothorax. There is mass with consolidation throughout the left lower lobe region. There is atelectatic change throughout the left upper lobe. The right lung is clear. Heart size is upper normal with pulmonary vascular within normal limits. Patient is status post internal mammary bypass grafting. No adenopathy appreciable.  IMPRESSION: No pneumothorax. Mass with consolidation on the left, essentially stable. Right lung clear. No change in cardiac silhouette.   Electronically Signed   By: Lowella Grip III M.D.   On: 08/19/2014 13:50   Mr Jeri Cos OI Contrast  08/27/2014   CLINICAL DATA:  80 year old male with recent diagnosis of stage IV lung cancer. Staging. Confusion, difficulty walking. Subsequent encounter.  EXAM: MRI HEAD WITHOUT AND WITH CONTRAST  TECHNIQUE: Multiplanar, multiecho pulse sequences of the brain and surrounding structures were obtained without and with intravenous contrast.  CONTRAST:  86m MULTIHANCE GADOBENATE DIMEGLUMINE 529 MG/ML IV SOLN  COMPARISON:  PET-CT  08/14/2014  FINDINGS: No midline shift, mass effect, or evidence of intracranial mass lesion. No abnormal enhancement identified. No dural thickening identified.  Visualized bone marrow signal is within normal limits, mild degenerative sclerosis at the tip of the odontoid.  Cerebral volume is within normal limits for age. No restricted diffusion to suggest acute infarction. No ventriculomegaly, extra-axial collection or acute intracranial hemorrhage. Cervicomedullary junction and pituitary are within normal limits. Major intracranial vascular flow voids are within normal limits.  Mild for age cerebral white matter T2 and FLAIR hyperintensity, including involvement at the posterior left corona radiata which most resembles a chronic lacunar infarct. Deep gray matter nuclei, brainstem and cerebellum are within normal limits for age.  Visible internal auditory structures appear normal. Mastoids and paranasal sinuses are clear. Normal orbits soft tissues aside from postoperative changes to the globes. Negative scalp soft tissues. Negative visualized cervical spinal cord.  IMPRESSION: 1.  No acute or metastatic intracranial abnormality. 2. Mild for age chronic small vessel disease.   Electronically Signed   By: HGenevie AnnM.D.   On: 08/27/2014 09:15   Nm Pet Image Initial (pi) Skull Base To Thigh  08/14/2014   CLINICAL DATA:  Initial treatment strategy for lung mass.  EXAM: NUCLEAR MEDICINE PET SKULL BASE TO THIGH  TECHNIQUE: Template mCi [id number] was injected intravenously. Full-ring PET imaging was performed from the skull base to thigh after the radiotracer. CT data was obtained and used for attenuation correction and anatomic localization.  FASTING BLOOD GLUCOSE:  Value: 129 mg/dl  COMPARISON:  Chest CT 07/31/2014.  FINDINGS: NECK  No hypermetabolic lymph nodes in the neck.  CHEST  Large infiltrative hypermetabolic (SUVmax = 176.7-20.9 mass centered in the left lower lobe measuring at least 9.8 x 11.8 cm. There is a  nodular thickening of the peribronchovascular and subpleural interstitium in the left upper lobe with multifocal hyper metabolism, compatible with lymphangitic spread of tumor. Moderate partially loculated pleural effusion with multifocal pleural hypermetabolism and nodularity, compatible with a malignant pleural effusion. Innumerable enlarged and hypermetabolic left hilar and mediastinal lymph nodes, including the largest lymph node in the subcarinal nodal station which measures up to 18 mm in short axis (SUVmax = 14.0). One of these hypermetabolic lymph nodes (SUVmax = 5.5)is a low right paratracheal lymph node measuring 11 mm (image 75 of series 4). No right hilar lymphadenopathy. In addition, there is lymphadenopathy in the superior mediastinum, with a 14 mm short axis lymph node adjacent to the proximal left subclavian artery (SUVmax = 16.5). 3.2 x 1.3 cm hypermetabolic (SUVmax =  8.1)lesion in the right lung is favored to represent an infected bronchocele, and is less likely to represent a metastasis or second primary lesion. Small right pleural effusion lying dependently. Heart size is normal. There is no significant pericardial fluid, thickening or pericardial calcification. There is atherosclerosis of the thoracic aorta, the great vessels of the mediastinum and the coronary arteries, including calcified atherosclerotic plaque in the left main, left anterior descending, left circumflex and right coronary arteries. Status post median sternotomy for CABG, including LIMA to the LAD. Severe calcifications of the aortic valve. 1.9 cm hypermetabolic (SUVmax = 1.3)YQMV thyroid nodule.  ABDOMEN/PELVIS  No abnormal hypermetabolic activity within the liver, pancreas, adrenal glands, or spleen. 11 mm soft tissue attenuation nodule (image 159 of series 4) in the left lower quadrant of the abdomen is hypermetabolic (SUVmax = 7.0), concerning for a metastatic peritoneal implant. Calcified gallstones lying dependently in  the gallbladder. No current findings to suggest an acute cholecystitis at this time. Postoperative changes of left-sided radical nephrectomy. Multiple low-attenuation lesions in the region of the right renal pelvis, presumably parapelvic cysts.  SKELETON  Multifocal sites of skeletal hypermetabolism throughout the visualized axial and appendicular skeleton, compatible with widespread skeletal metastasis. The largest site of hyper metabolism is a very ill-defined lucent lesion in the left side of the T11 vertebral body (SUVmax = 8.8).  IMPRESSION: 1. Findings, as above, compatible with stage IV (T4, N3, M1b) lung cancer. Specifically, there is a large mass with epicenter in the left lower lobe, lymphangitic spread of tumor in the left upper lobe, malignant left pleural effusion, left hilar and bilateral mediastinal lymphadenopathy, as well as metastatic disease to the peritoneal and visualized axial and appendicular skeleton, as detailed above. 2. Hypermetabolic 1.9 cm left thyroid nodule. This could represent a metastasis or primary thyroid neoplasm. 3. 3.2 x 1.3 cm elongated hypermetabolic lesion in the right upper lobe is favored to represent an infected bronchocele, and is less likely to represent a metastatic lesion. 4. Additional incidental findings, as above.   Electronically Signed   By: Vinnie Langton M.D.   On: 08/14/2014 12:39   Ct Biopsy  08/19/2014   CLINICAL DATA:  LARGE HYPERMETABOLIC LEFT LOWER LOBE MASS  EXAM: CT GUIDED CORE BIOPSY OF LEFT LOWER LOBE MASS  ANESTHESIA/SEDATION: 0.5  Mg IV Versed; 25 mcg IV Fentanyl  Total Moderate Sedation Time: 15 minutes.  PROCEDURE: The procedure risks, benefits, and alternatives were explained to the patient. Questions regarding the procedure were encouraged and answered. The patient understands and consents to the procedure.  The LEFT POSTERIOR BACK was prepped with Betadinein a sterile fashion, and a sterile drape was applied covering the operative field. A  sterile gown and sterile gloves were used for the procedure. Local anesthesia was provided with 1% Lidocaine.  Previous imaging reviewed. Patient positioned left side down decubitus. Noncontrast localization CT performed. The large left lower lobe mass was localized with CT. Under sterile conditions and local anesthesia, a 17 gauge 6.8 cm access needle was advanced from a posterior intercostal approach into the lesion along the posterior margin. Needle position confirmed with CT. 18 gauge core biopsies obtained. Samples placed in formalin. Needle tract embolized with a biosentry device.  Complications: None immediate  FINDINGS: Imaging confirms needle placement into the large left lower lobe mass from a posterior approach for core biopsy  IMPRESSION: Successful CT-guided left lower lobe mass 18 gauge core biopsy   Electronically Signed   By: Jerilynn Mages.  Shick M.D.   On:  08/19/2014 13:05   Dg Chest Port 1 View  08/04/2014   CLINICAL DATA:  Status post bronchoscopy.  EXAM: PORTABLE CHEST - 1 VIEW  COMPARISON:  07/31/2014  FINDINGS: Chronic cardiomegaly. There is new pulmonary venous congestion.No evidence of air leak. Dense left lower lobe mass with pleural thickening, likely malignant as described on chest CT 07/31/2014. Changes of CABG.  IMPRESSION: 1. Pulmonary venous congestion. 2. No air leak after bronchoscopy. 3. Known large left lower lobe mass with pleural fluid and thickening.   Electronically Signed   By: Monte Fantasia M.D.   On: 08/04/2014 12:47   Dg C-arm Bronchoscopy  08/04/2014   CLINICAL DATA:    C-ARM BRONCHOSCOPY  Fluoroscopy was utilized by the requesting physician.  No radiographic  interpretation.     ASSESSMENT AND PLAN: This is a very pleasant 80 years old white male recently diagnosed with a stage IV non-small cell lung cancer, sarcomatoid carcinoma presented with large mass in the left lower lobe in addition to lymphangitic spread as well as bilateral mediastinal lymphadenopathy and  metastatic bone lesions. I had a lengthy discussion with the patient and his family today about his current disease stage, prognosis and treatment options. I discussed with the patient the option of palliative care and hospice referral versus consideration of systemic chemotherapy with reduced dose carboplatin for AUC of 5 and paclitaxel 175 MG/M2 every 3 weeks with Neulasta support. The patient is interested in proceeding with systemic chemotherapy but he would like some more time to get stronger before starting the treatment. I discussed with the patient adverse effect of this treatment including but not limited to alopecia, myelosuppression, nausea and vomiting, peripheral neuropathy, liver or renal dysfunction. He is expected to start the first cycle of this treatment next week. The patient has significant fatigue and weakness as well as dehydration and his daughters doesn't think they would be able to take him back home with concern about inability to get him in the house because of his significant weakness. I offered them IV hydration at the [facility] but the daughter saying that the patient may need to be admitted for further evaluation and management of his weakness and dehydration and in the hospital setting. We will transfer the patient to the emergency department for further evaluation and admission if needed. I will arrange for the patient to come back for follow-up visit in 2 weeks for reevaluation and management of any adverse effect of his chemotherapy. He will have a chemotherapy education class before starting the first dose of his treatment. The patient was advised to call immediately if he has any concerning symptoms in the interval. The patient voices understanding of current disease status and treatment options and is in agreement with the current care plan.  All questions were answered. The patient knows to call the clinic with any problems, questions or concerns. We can  certainly see the patient much sooner if necessary.  I spent 15 minutes counseling the patient face to face. The total time spent in the appointment was 25 minutes.  Disclaimer: This note was dictated with voice recognition software. Similar sounding words can inadvertently be transcribed and may not be corrected upon review.  ADDENDUM:  comprehensive metabolic panel became available after the patient was transferred to the emergency department and it showed elevated serum calcium. The patient will need aggressive IV hydration in addition to Zometa 4 mg IV for treatment of the hypercalcemia of malignancy.

## 2014-09-01 NOTE — H&P (Signed)
PCP:  Laurey Morale, MD  Oncology [facility] Georg[facility] Hacking  Referring provider Orvil Feil PA   Chief Complaint:  Send from Dr. Julien Nordmann office due to high calcium  HPI: John Potter is a 80 y.o. male   has a past medical history of CAD (coronary artery disease); Hypertension; Hyperlipidemia; Rheumatic fever ("I was an infant"); Hyperglycemia; BPH (benign prostatic hyperplasia); Heart murmur (dx'd 1958); Type II diabetes mellitus (dx'd ~ 04/2014); History of gout; Kidney carcinoma (1999); and Malnutrition related to chronic disease (08/21/2014).   Presented with  Patient was recently diagnosed with metastatic Lung Cancer (stage IV non-small cell lung cancer with mediastinal lymphadenopathy metastatic disease to peritoneum and bones) under care of Dr. Julien Nordmann With plan to start chemo next week. For the past few days he has been more confused.  Decreased PO intake no appetite no solid food. Significant vomiting of all PO intake.  He has been taking all his medications including Lasix lisinopril , metoprolol. Patient was seen in office by Dr. Julien Nordmann blood work showed Ca 14 he was sent to ER.  On Arrival to ER BP 91/30 respiration up to 21,  afebrile Denies any fever but have had some chills. WBC 16. Given above patient meeting sepsis criteria chest x-ray worrisome for postobstructive consolidation. Case was discussed at length with pharmacy is well as Dr. Learta Codding with oncology with plan to treat with Pamidronon and calcitonin with fluid resuscitation .  patient has known history of coronary artery disease, Denies any chest pain  He has ongoing shortness of breath which is likely secondary to large mass with pleural effusion  Hospitalist was called for admission for Hypercalcemia and dehydration.   Review of Systems:    Pertinent positives include:  chills, fatigue, weight loss vomiting, constipation, shortness of breath at rest. dyspnea on exertion,   Bilateral lower extremity swelling confusion  Constitutional:  No weight loss, night sweats, Fevers, HEENT:  No headaches, Difficulty swallowing,Tooth/dental problems,Sore throat,  No sneezing, itching, ear ache, nasal congestion, post nasal drip,  Cardio-vascular:  No chest pain, Orthopnea, PND, anasarca, dizziness, palpitations.no GI:  No heartburn, indigestion, abdominal pain, nausea, diarrhea, change in bowel habits, loss of appetite, melena, blood in stool, hematemesis Resp:    No excess mucus, no productive cough, No non-productive cough, No coughing up of blood.No change in color of mucus.No wheezing. Skin:  no rash or lesions. No jaundice GU:  no dysuria, change in color of urine, no urgency or frequency. No straining to urinate.  No flank pain.  Musculoskeletal:  No joint pain or no joint swelling. No decreased range of motion. No back pain.  Psych:  No change in mood or affect. No depression or anxiety. No memory loss.  Neuro: no localizing neurological complaints, no tingling, no weakness, no double vision, no gait abnormality, no slurred speech, no   Otherwise ROS are negative except for above, 10 systems were reviewed  Past Medical History: Past Medical History  Diagnosis Date  . CAD (coronary artery disease)     sees Dr. Dani Gobble [place]   . Hypertension   . Hyperlipidemia   . Rheumatic fever "I was an infant"  . Hyperglycemia   . BPH (benign prostatic hyperplasia)     sees Dr. Junious Silk   . Heart murmur dx'd 1958  . Type II diabetes mellitus dx'd ~ 04/2014    "mild; advised by MD to lose weight; no RX" (07/30/2014)  . History of gout   . Kidney carcinoma  1999    sees Dr. Junious Silk   . Malnutrition related to chronic disease 08/21/2014   Past Surgical History  Procedure Laterality Date  . Coronary artery bypass graft  1991    CABG X 3; per Dr. Redmond Pulling   . Nephrectomy Left 1999     per Dr. Hessie Diener   . Vasectomy    . Tonsillectomy and adenoidectomy    .  Colonoscopy  2010    clear, repeat in 10 yrs   . US echocardiography  07/14/2009    mod-severe LVH,EF =>55%,LA mildly dilated,mild mitral & aortic annular ca+, AOV mod. sclerotic,discrete nodular thickening of the non-coronary cusp  . Cataract extraction w/ intraocular lens  implant, bilateral Bilateral 2000's  . Cardiac catheterization  1991  . Video bronchoscopy Bilateral 08/04/2014    Procedure: VIDEO BRONCHOSCOPY WITH FLUORO;  Surgeon: Collene Gobble, MD;  Location: [facility];  Service: Cardiopulmonary;  Laterality: Bilateral;     Medications: Prior to Admission medications   Medication Sig Start Date End Date Taking? Authorizing Provider  aspirin EC 81 MG tablet Take 81 mg by mouth every evening.   Yes Historical Provider, MD  Calcium Carb-Cholecalciferol (CALCIUM 600 + D) 600-200 MG-UNIT TABS Take 1 tablet by mouth daily.   Yes Historical Provider, MD  ferrous sulfate 325 (65 FE) MG tablet TAKE 1 TABLET BY MOUTH ONCE DAILY WITH BREAKFAST 01/23/14  Yes Laurey Morale, MD  fish oil-omega-3 fatty acids 1000 MG capsule Take 2 g by mouth daily.     Yes Historical Provider, MD  furosemide (LASIX) 40 MG tablet Take 1 tablet (40 mg total) by mouth 3 (three) times a week. 07/23/14  Yes Mihai [place], MD  lisinopril (PRINIVIL,ZESTRIL) 5 MG tablet Take 1 tablet (5 mg total) by mouth daily. 08/29/14  Yes Mihai [facility], MD  metoprolol tartrate (LOPRESSOR) 25 MG tablet Take 25 mg by mouth 2 (two) times daily.     Yes Historical Provider, MD  Multiple Vitamin (MULTIVITAMIN) tablet Take 1 tablet by mouth daily.     Yes Historical Provider, MD  Multiple Vitamins-Minerals (PRESERVISION/LUTEIN) CAPS Take 2 capsules by mouth daily.    Yes Historical Provider, MD  simvastatin (ZOCOR) 20 MG tablet Take 20 mg by mouth at bedtime.     Yes Historical Provider, MD  Tamsulosin HCl (FLOMAX) 0.4 MG CAPS Take 0.4 mg by mouth daily.   Yes Historical Provider, MD  vitamin C (ASCORBIC ACID) 500 MG tablet Take 1,000 mg by  mouth daily.    Yes Historical Provider, MD  hydrocortisone 2.5 % cream Apply topically 2 (two) times daily. Patient not taking: Reported on 09/01/2014 08/14/14   Collene Gobble, MD  methylPREDNISolone (MEDROL DOSEPAK) 4 MG TBPK tablet Use as instructed Patient not taking: Reported on 09/01/2014 08/21/14   Curt Bears, MD  prochlorperazine (COMPAZINE) 10 MG tablet Take 1 tablet (10 mg total) by mouth every 6 (six) hours as needed for nausea or vomiting. Patient not taking: Reported on 09/01/2014 09/01/14   Curt Bears, MD    Allergies:   Allergies  Allergen Reactions  . Amoxicillin     rash    Social History:  Ambulatory   [place]  Lives at home alone,      reports that he quit smoking about 41 years ago. His smoking use included Cigarettes. He has a 16 pack-year smoking history. He has never used smokeless tobacco. He reports that he does not drink alcohol or use illicit drugs.    Family  History: family history includes Cancer in his brother and father; Diabetes in his mother; Heart disease in his mother; Hyperlipidemia in his mother; Hypertension in his mother.    Physical Exam: Patient Vitals for the past 24 hrs:  BP Temp Temp src Pulse Resp SpO2  09/01/14 1823 (!) 106/46 mmHg - - 75 18 95 %  09/01/14 1815 - - - 72 21 95 %  09/01/14 1700 (!) 111/44 mmHg - - 66 20 95 %  09/01/14 1614 107/70 mmHg - - 69 18 91 %    1. General:  in No Acute distress 2. Psychological: Alert and   Oriented 3. Head/ENT:     Dry Mucous Membranes                          Head Non traumatic, neck supple                            Poor Dentition 4. SKIN:  decreased Skin turgor,  Skin clean Dry and intact no rash 5. Heart: Regular rate and rhythm systolic Murmur, Rub or gallop 6. Lungs:  no wheezes some crackles   decreased breath sounds at the bases  7. Abdomen: Soft, non-tender, Non distended 8. Lower extremities: no clubbing, cyanosis, 2+ edema 9. Neurologically Grossly intact, moving all 4  extremities equally 10. MSK: Normal range of motion  body mass index is unknown because there is no weight on file.   Labs on Admission:   Results for orders placed or performed during the hospital encounter of 09/01/14 (from the past 24 hour(s))  Lipase, blood     Status: None   Collection Time: 09/01/14  4:37 PM  Result Value Ref Range   Lipase 42 22 - 51 U/L  I-Stat CG4 Lactic Acid, ED     Status: Abnormal   Collection Time: 09/01/14  4:45 PM  Result Value Ref Range   Lactic Acid, Venous 2.65 (HH) 0.5 - 2.0 mmol/L   Comment NOTIFIED PHYSICIAN   I-stat troponin, ED     Status: None   Collection Time: 09/01/14  5:12 PM  Result Value Ref Range   Troponin i, poc 0.05 0.00 - 0.08 ng/mL   Comment 3          Urinalysis, Routine w reflex microscopic (not at [facility])     Status: Abnormal   Collection Time: 09/01/14  5:40 PM  Result Value Ref Range   Color, Urine YELLOW YELLOW   APPearance CLEAR CLEAR   Specific Gravity, Urine 1.020 1.005 - 1.030   pH 5.0 5.0 - 8.0   Glucose, UA NEGATIVE NEGATIVE mg/dL   Hgb urine dipstick NEGATIVE NEGATIVE   Bilirubin Urine NEGATIVE NEGATIVE   Ketones, ur NEGATIVE NEGATIVE mg/dL   Protein, ur NEGATIVE NEGATIVE mg/dL   Urobilinogen, UA 1.0 0.0 - 1.0 mg/dL   Nitrite NEGATIVE NEGATIVE   Leukocytes, UA SMALL (A) NEGATIVE  Urine microscopic-add on     Status: None   Collection Time: 09/01/14  5:40 PM  Result Value Ref Range   WBC, UA 3-6 <3 WBC/hpf   Urine-Other MUCOUS PRESENT     UA 3-6 WBC no nitrites  Lab Results  Component Value Date   HGBA1C 6.8* 03/20/2014    Estimated Creatinine Clearance: 43.3 mL/min (by C-G formula based on Cr of 1.6).  BNP (last 3 results) No results for input(s): PROBNP in the last 8760 hours.  Other results:  I have pearsonaly reviewed this: ECG REPORT  Rate 70  Rhythm: LBBB ST&T Change: NA QTC 380  Last echogram in June 2016 showing EF of 50-60%, moderate LVH mild aortic stenosis   There were no  vitals filed for this visit.   Cultures:    Component Value Date/Time   SDES PLEURAL FLUID 07/31/2014 1215   SDES PLEURAL FLUID 07/31/2014 1215   SPECREQUEST NONE 07/31/2014 1215   SPECREQUEST NONE 07/31/2014 1215   CULT  07/31/2014 1215    CULTURE WILL BE EXAMINED FOR 6 WEEKS BEFORE ISSUING A FINAL REPORT Performed at [facility]  07/31/2014 1215    No Fungi Isolated in 4 Weeks Performed at [facility] PENDING 07/31/2014 1215   REPTSTATUS 08/28/2014 FINAL 07/31/2014 1215     Radiological Exams on Admission: Dg Chest 2 View  09/01/2014   CLINICAL DATA:  Altered mental status. SOB. No fever. Pt takes HTN meds. Borderline diabetic. Quit smoking in 1975. Smoked for 20 years prior to quitting  EXAM: CHEST  2 VIEW  COMPARISON:  08/19/2014  FINDINGS: Left lower to mid lung zone opacity reflects a combination of the known left lower lobe central mass and postobstructive atelectasis/consolidation. There is also a loculated pleural fluid on the left as well as hazy parenchymal opacity and irregular interstitial thickening. These findings are similar to the most recent prior exam.  Right lung is clear.  No right pleural effusion.  There are changes from previous cardiac surgery, stable. Cardiac silhouette is partly obscured by the contiguous left lung and pleural opacity. It appears mildly enlarged but stable.  No pneumothorax.  Multiple left upper quadrant vascular clips, stable.  Bony thorax is grossly intact.  IMPRESSION: 1. No significant change from the recent prior study. 2. Persistent findings on the left related to the left lower lobe central mass and postobstructive left lower lobe atelectasis/consolidation as well as loculated areas of left pleural fluid and areas of irregular interstitial thickening.   Electronically Signed   By: Lajean Manes M.D.   On: 09/01/2014 17:52    Chart has been reviewed  Family  at  Bedside  plan of care was discussed  with  Daughter [name] MaHaFFey [phone]  Assessment/Plan  80 year-old gentleman history of new diagnosis of metastatic lung cancer with metastases to the bone, history of coronary disease hypertension diabetes, chronic kidney disease, heart failure presents with calcium of 14, altered mental 1.9 with severe malnutrition. Elevated white blood cell count and hypotension in a setting of dehydration and sepsis due to likely postobstructive pneumonia  Present on Admission:  . Hypercalcemia - monitor on telemetry, treat with IV fluids with hydration, bisphosphonate and calcitonin. Repeat calcium in the morning if no improvement may require steroids . CAD (coronary artery disease) -currently chest pain-free continue aspirin and statin  . Essential hypertension - patient was hypotensive on arrival to emergency department for now will hold metoprolol. Hold the Cipro given worsening renal function  . CKD (chronic kidney disease) stage 3, GFR 30-59 ml/min  -acute on chronic worsening renal function at baseline creatinine 1.3 currently up to 1.6  most likely secondary to dehydration will hold lisinopril and Lasix give fluids  . Lung mass - discussed case with oncology Dr. Julien Nordmann to see patient tomorrow. The plan was to undergo chemotherapy in the near future given her severe hypercalcemia, dehydration and possible postobstructive pneumonia at this issues needs to be addressed  first  . Pneumonia - given elevated white cell count and evidence of postobstructive pneumonia and plain film will treat with antibiotics  . Sepsis- Will treat with IV antibiotics, rehydrate, currently blood pressure improved up to 133/54. Will obtain blood cultures, pro-calcitonin, serial lactic acid history of heart failure  - per last echogram in June patient has preserved EF evidence of LVH noted suggestive of diastolic dysfunction  Diabetes mellitus type 2 patient stayed diet-controlled will order sliding scale and hemoglobin A1c     malnutrition Will order prealbumin, nutritional consult, albumen level I.9 with history of significant anorexia  Prophylaxis:   Lovenox   CODE STATUS:  DNR/DNI as per family  Disposition:   likely will need placement for rehabilitation family would prefer to go home but agreeable to placement as last resort.                             Other plan as per orders.  I have spent a total of 75 min on this admission  except and was taken to discuss case with pharmacy as well as oncology Dr. Lovie Chol 09/01/2014, 7:17 PM  [facility]  Pager [phone]   after 2 AM please page floor coverage PA If 7AM-7PM, please contact the day team taking care of the patient  [email or web address]  Password [email or web address]

## 2014-09-01 NOTE — Progress Notes (Signed)
Oncology Nurse Navigator Documentation  Oncology Nurse Navigator Flowsheets 08/29/2014  Navigator Encounter Type Other/per Dr. Julien Nordmann I referred patient to palliative care  Patient Visit Type Follow-up  Interventions Other;Coordination of Care  Coordination of Care Other  Education Method -  Time Spent with Patient 15

## 2014-09-01 NOTE — ED Provider Notes (Signed)
CSN: [id number]     Arrival date & time 09/01/14  1602 History   First MD Initiated Contact with Patient 09/01/14 1605     Chief Complaint  Patient presents with  . Hypercalcemia   . Weakness     (Consider location/radiation/quality/duration/timing/severity/associated sxs/prior Treatment) The history is provided by the patient, a relative and medical records. No language interpreter was used.     John Potter is a 80 y.o. male  with a hx of CAD, HTN, BPH, NIDDM, gout, kidney carcinoma presents to the Emergency Department from the [facility] complaining of gradual, persistent, progressively worsening weakness onset 3 days ago. Today pt was given a diagnosis of Stage IV (T4, N3, M1b) non-small cell lung cancer, sarcomatoid carcinoma diagnosed in August 2016 presented with large left lower lobe lung mass, lymphangitic spread of tumor as well as mediastinal lymphadenopathy and metastatic disease to the peritonium and bones.  He is scheduled to begin carboplatin for AUC of 5 and paclitaxel 175 MG/M2 every 3 weeks with Neulasta support on 09/10/14.    Pt's daughters are at bedside reporting that pt has has decreased PO intake for the last 3 days with increasing weakness.  He had 3 episodes of NBNB emesis with stomach contents today.  Pt reports feeling generally weak, but denies pain at this time.  Daughter also reports some confusion today/  Patient and daughter deny sick contacts or recent hospitalizations.  No known aggravating or alleviating factors.  Pt denies fever, chills, headache, neck pain, chest pain, abd pain, diarrhea, weakness, dizziness, syncope.  He reports SOB at baseline, not worse today.     PCP: Delma Freeze - [place]  Past Medical History  Diagnosis Date  . CAD (coronary artery disease)     sees Dr. Dani Gobble Croitoru   . Hypertension   . Hyperlipidemia   . Rheumatic fever "I was an infant"  . Hyperglycemia   . BPH (benign prostatic hyperplasia)     sees Dr. Junious Silk   .  Heart murmur dx'd 1958  . Type II diabetes mellitus dx'd ~ 04/2014    "mild; advised by MD to lose weight; no RX" (07/30/2014)  . History of gout   . Kidney carcinoma 1999    sees Dr. Junious Silk   . Malnutrition related to chronic disease 08/21/2014   Past Surgical History  Procedure Laterality Date  . Coronary artery bypass graft  1991    CABG X 3; per Dr. Redmond Pulling   . Nephrectomy Left 1999     per Dr. Hessie Diener   . Vasectomy    . Tonsillectomy and adenoidectomy    . Colonoscopy  2010    clear, repeat in 10 yrs   . US echocardiography  07/14/2009    mod-severe LVH,EF =>55%,LA mildly dilated,mild mitral & aortic annular ca+, AOV mod. sclerotic,discrete nodular thickening of the non-coronary cusp  . Cataract extraction w/ intraocular lens  implant, bilateral Bilateral 2000's  . Cardiac catheterization  1991  . Video bronchoscopy Bilateral 08/04/2014    Procedure: VIDEO BRONCHOSCOPY WITH FLUORO;  Surgeon: Collene Gobble, MD;  Location: [facility];  Service: Cardiopulmonary;  Laterality: Bilateral;   Family History  Problem Relation Age of Onset  . Heart disease Father   . Hyperlipidemia Father   . Hypertension Father   . Diabetes Father    Social History  Substance Use Topics  . Smoking status: Former Smoker -- 1.00 packs/day for 16 years    Types: Cigarettes    Quit  date: 07/17/1973  . Smokeless tobacco: Never Used  . Alcohol Use: No    Review of Systems  Constitutional: Positive for appetite change (decreased x 3 days) and fatigue. Negative for fever, diaphoresis and unexpected weight change.  HENT: Negative for mouth sores.   Eyes: Negative for visual disturbance.  Respiratory: Negative for cough, chest tightness, shortness of breath and wheezing.   Cardiovascular: Negative for chest pain.  Gastrointestinal: Positive for vomiting (only after eating). Negative for nausea, abdominal pain, diarrhea and constipation.  Endocrine: Negative for polydipsia, polyphagia and  polyuria.  Genitourinary: Negative for dysuria, urgency, frequency and hematuria.  Musculoskeletal: Negative for back pain and neck stiffness.  Skin: Negative for rash.  Allergic/Immunologic: Negative for immunocompromised state.  Neurological: Positive for weakness ( generalized). Negative for syncope, light-headedness and headaches.  Hematological: Does not bruise/bleed easily.  Psychiatric/Behavioral: Positive for confusion (per daughter). Negative for sleep disturbance. The patient is not nervous/anxious.       Allergies  Amoxicillin  Home Medications   Prior to Admission medications   Medication Sig Start Date End Date Taking? Authorizing Provider  aspirin EC 81 MG tablet Take 81 mg by mouth every evening.   Yes Historical Provider, MD  Calcium Carb-Cholecalciferol (CALCIUM 600 + D) 600-200 MG-UNIT TABS Take 1 tablet by mouth daily.   Yes Historical Provider, MD  ferrous sulfate 325 (65 FE) MG tablet TAKE 1 TABLET BY MOUTH ONCE DAILY WITH BREAKFAST 01/23/14  Yes Laurey Morale, MD  fish oil-omega-3 fatty acids 1000 MG capsule Take 2 g by mouth daily.     Yes Historical Provider, MD  furosemide (LASIX) 40 MG tablet Take 1 tablet (40 mg total) by mouth 3 (three) times a week. 07/23/14  Yes Mihai Croitoru, MD  lisinopril (PRINIVIL,ZESTRIL) 5 MG tablet Take 1 tablet (5 mg total) by mouth daily. 08/29/14  Yes Mihai Croitoru, MD  metoprolol tartrate (LOPRESSOR) 25 MG tablet Take 25 mg by mouth 2 (two) times daily.     Yes Historical Provider, MD  Multiple Vitamin (MULTIVITAMIN) tablet Take 1 tablet by mouth daily.     Yes Historical Provider, MD  Multiple Vitamins-Minerals (PRESERVISION/LUTEIN) CAPS Take 2 capsules by mouth daily.    Yes Historical Provider, MD  simvastatin (ZOCOR) 20 MG tablet Take 20 mg by mouth at bedtime.     Yes Historical Provider, MD  Tamsulosin HCl (FLOMAX) 0.4 MG CAPS Take 0.4 mg by mouth daily.   Yes Historical Provider, MD  vitamin C (ASCORBIC ACID) 500 MG tablet  Take 1,000 mg by mouth daily.    Yes Historical Provider, MD  hydrocortisone 2.5 % cream Apply topically 2 (two) times daily. Patient not taking: Reported on 09/01/2014 08/14/14   Collene Gobble, MD  methylPREDNISolone (MEDROL DOSEPAK) 4 MG TBPK tablet Use as instructed Patient not taking: Reported on 09/01/2014 08/21/14   Curt Bears, MD  prochlorperazine (COMPAZINE) 10 MG tablet Take 1 tablet (10 mg total) by mouth every 6 (six) hours as needed for nausea or vomiting. Patient not taking: Reported on 09/01/2014 09/01/14   Curt Bears, MD   BP 106/46 mmHg  Pulse 75  Temp(Src)  (Rectal)  Resp 18  SpO2 95% Physical Exam  Constitutional: He is oriented to person, place, and time. He appears well-developed and well-nourished. No distress.  Awake, alert,  Tired appearing  HENT:  Head: Normocephalic and atraumatic.  Mouth/Throat: Oropharynx is clear and moist. No oropharyngeal exudate.  Eyes: Conjunctivae are normal. Pupils are equal, round, and reactive to  light. No scleral icterus.  Neck: Normal range of motion. Neck supple.  Cardiovascular: Normal rate, regular rhythm and intact distal pulses.   Murmur heard. Pulmonary/Chest: Effort normal and breath sounds normal. No respiratory distress. He has no wheezes.  Equal chest expansion  Abdominal: Soft. Bowel sounds are normal. He exhibits no distension and no mass. There is no tenderness. There is no rebound and no guarding.  Soft and nontender  Musculoskeletal: Normal range of motion. He exhibits edema ( 3+ BLE).  Neurological: He is alert and oriented to person, place, and time. No cranial nerve deficit. He exhibits normal muscle tone. Coordination normal.  Speech is clear and goal oriented Pt A&Ox4 Moves extremities without ataxia  Skin: Skin is warm and dry. He is not diaphoretic. No erythema. There is pallor.  Psychiatric: He has a normal mood and affect.  Nursing note and vitals reviewed.   ED Course  Procedures (including  critical care time) Labs Review Labs Reviewed  URINALYSIS, [place] (NOT AT [facility]) - Abnormal; Notable for the following:    Leukocytes, UA SMALL (*)    All other components within normal limits  I-STAT CG4 LACTIC ACID, ED - Abnormal; Notable for the following:    Lactic Acid, Venous 2.65 (*)    All other components within normal limits  LIPASE, BLOOD  URINE MICROSCOPIC-ADD ON  CALCIUM, IONIZED  I-STAT TROPOININ, ED    Imaging Review Dg Chest 2 View  09/01/2014   CLINICAL DATA:  Altered mental status. SOB. No fever. Pt takes HTN meds. Borderline diabetic. Quit smoking in 1975. Smoked for 20 years prior to quitting  EXAM: CHEST  2 VIEW  COMPARISON:  08/19/2014  FINDINGS: Left lower to mid lung zone opacity reflects a combination of the known left lower lobe central mass and postobstructive atelectasis/consolidation. There is also a loculated pleural fluid on the left as well as hazy parenchymal opacity and irregular interstitial thickening. These findings are similar to the most recent prior exam.  Right lung is clear.  No right pleural effusion.  There are changes from previous cardiac surgery, stable. Cardiac silhouette is partly obscured by the contiguous left lung and pleural opacity. It appears mildly enlarged but stable.  No pneumothorax.  Multiple left upper quadrant vascular clips, stable.  Bony thorax is grossly intact.  IMPRESSION: 1. No significant change from the recent prior study. 2. Persistent findings on the left related to the left lower lobe central mass and postobstructive left lower lobe atelectasis/consolidation as well as loculated areas of left pleural fluid and areas of irregular interstitial thickening.   Electronically Signed   By: Lajean Manes M.D.   On: 09/01/2014 17:52   I, Raaga Maeder, Jarrett Soho, personally reviewed and evaluated these images and lab results as part of my medical decision-making.   EKG Interpretation   Date/Time:  Monday September 01 2014 16:11:22 EDT Ventricular Rate:  70 PR Interval:  157 QRS Duration: 137 QT Interval:  352 QTC Calculation: 380 R Axis:   29 Text Interpretation:  Sinus rhythm Left bundle branch block Baseline  wander in lead(s) V4 No prior EKG for comparison Confirmed by LIU MD, DANA  ([id number]) on 09/01/2014 4:18:32 PM      MDM   Final diagnoses:  Hypercalcemia  Malignant neoplasm of lower lobe of left lung  Anorexia  Peripheral edema   Venita Sheffield presents with with 3 days of increasing weakness.  Patient was seen by his oncologist this morning. Labwork was obtained  with a leukocytosis of 16 along with a calcium of 14. Patient's BUN is 15 and creatinine is 1.6. No anion gap.  Patient is very weak however nonfocal. Patient's family states he is confused however he is able to appropriately answer all of my questions and is oriented 4.  Family reports 3 episodes of emesis today each time the patient attempted to eat. His abdomen is soft and nontender. Slightly elevated AST and ALP at 60 and 61 respectively. Normal lipase.  ECG with LBBB and hx of same.    6:25 PM Urinalysis without evidence of urinary tract infection, elevated lactic acid 2.65. Chest x-ray without evidence of pneumonia. Is stable from previous  patient has not had begun chemotherapy and is not immunocompromised. Patient will need admission for hypercalcemia and fluid hydration.  The patient was discussed with and seen by Dr. Oleta Mouse who agrees with the treatment plan.  BP 106/46 mmHg  Pulse 75  Temp(Src)  (Rectal)  Resp 18  SpO2 95%  6:50 PM Discussed with Dr. Darnell Level who will admit to tele.     Abigail Butts, PA-C 09/01/14 [place] Liu, MD 09/02/14 [place] Liu, MD 09/03/14 [phone]

## 2014-09-01 NOTE — ED Notes (Signed)
Patient transported to X-ray 

## 2014-09-01 NOTE — ED Notes (Signed)
Pt sent from cancer center for weakness, hypercalcemia, hypotension. Pt denies pain. Pt takes calcium supplement due to poor appetite.

## 2014-09-02 ENCOUNTER — Telehealth: Payer: Self-pay | Admitting: Family Medicine

## 2014-09-02 ENCOUNTER — Telehealth: Payer: Self-pay | Admitting: *Deleted

## 2014-09-02 ENCOUNTER — Other Ambulatory Visit: Payer: Self-pay

## 2014-09-02 ENCOUNTER — Telehealth: Payer: Self-pay | Admitting: Internal Medicine

## 2014-09-02 ENCOUNTER — Encounter: Payer: Self-pay | Admitting: *Deleted

## 2014-09-02 DIAGNOSIS — D649 Anemia, unspecified: Secondary | ICD-10-CM

## 2014-09-02 DIAGNOSIS — E1122 Type 2 diabetes mellitus with diabetic chronic kidney disease: Secondary | ICD-10-CM

## 2014-09-02 DIAGNOSIS — N189 Chronic kidney disease, unspecified: Secondary | ICD-10-CM

## 2014-09-02 DIAGNOSIS — L8990 Pressure ulcer of unspecified site, unspecified stage: Secondary | ICD-10-CM | POA: Insufficient documentation

## 2014-09-02 DIAGNOSIS — E440 Moderate protein-calorie malnutrition: Secondary | ICD-10-CM | POA: Insufficient documentation

## 2014-09-02 LAB — LEGIONELLA ANTIGEN, URINE

## 2014-09-02 LAB — CBC WITH DIFFERENTIAL/PLATELET
BASOS PCT: 0 % (ref 0–1)
Basophils Absolute: 0 10*3/uL (ref 0.0–0.1)
EOS ABS: 0.1 10*3/uL (ref 0.0–0.7)
Eosinophils Relative: 1 % (ref 0–5)
HCT: 30.4 % — ABNORMAL LOW (ref 39.0–52.0)
Hemoglobin: 9 g/dL — ABNORMAL LOW (ref 13.0–17.0)
Lymphocytes Relative: 10 % — ABNORMAL LOW (ref 12–46)
Lymphs Abs: 1.6 10*3/uL (ref 0.7–4.0)
MCH: 25.5 pg — ABNORMAL LOW (ref 26.0–34.0)
MCHC: 29.6 g/dL — AB (ref 30.0–36.0)
MCV: 86.1 fL (ref 78.0–100.0)
MONO ABS: 1 10*3/uL (ref 0.1–1.0)
MONOS PCT: 7 % (ref 3–12)
Neutro Abs: 12.3 10*3/uL — ABNORMAL HIGH (ref 1.7–7.7)
Neutrophils Relative %: 82 % — ABNORMAL HIGH (ref 43–77)
PLATELETS: 275 10*3/uL (ref 150–400)
RBC: 3.53 MIL/uL — ABNORMAL LOW (ref 4.22–5.81)
RDW: 16.5 % — AB (ref 11.5–15.5)
WBC: 15 10*3/uL — ABNORMAL HIGH (ref 4.0–10.5)

## 2014-09-02 LAB — COMPREHENSIVE METABOLIC PANEL
ALBUMIN: 2 g/dL — AB (ref 3.5–5.0)
ALK PHOS: 69 U/L (ref 38–126)
ALT: 52 U/L (ref 17–63)
ANION GAP: 5 (ref 5–15)
AST: 57 U/L — AB (ref 15–41)
BILIRUBIN TOTAL: 0.6 mg/dL (ref 0.3–1.2)
BUN: 49 mg/dL — AB (ref 6–20)
CALCIUM: 12.1 mg/dL — AB (ref 8.9–10.3)
CO2: 28 mmol/L (ref 22–32)
Chloride: 107 mmol/L (ref 101–111)
Creatinine, Ser: 1.45 mg/dL — ABNORMAL HIGH (ref 0.61–1.24)
GFR calc Af Amer: 52 mL/min — ABNORMAL LOW (ref >60)
GFR, EST NON AFRICAN AMERICAN: 45 mL/min — AB (ref >60)
GLUCOSE: 126 mg/dL — AB (ref 65–99)
Potassium: 4.9 mmol/L (ref 3.5–5.1)
Sodium: 140 mmol/L (ref 135–145)
TOTAL PROTEIN: 6.1 g/dL — AB (ref 6.5–8.1)

## 2014-09-02 LAB — GLUCOSE, CAPILLARY
GLUCOSE-CAPILLARY: 101 mg/dL — AB (ref 65–99)
GLUCOSE-CAPILLARY: 118 mg/dL — AB (ref 65–99)
GLUCOSE-CAPILLARY: 104 mg/dL — AB (ref 65–99)
GLUCOSE-CAPILLARY: 136 mg/dL — AB (ref 65–99)

## 2014-09-02 LAB — PREALBUMIN: Prealbumin: 8.9 mg/dL — ABNORMAL LOW (ref 18–38)

## 2014-09-02 LAB — STREP PNEUMONIAE URINARY ANTIGEN: Strep Pneumo Urinary Antigen: NEGATIVE

## 2014-09-02 LAB — CORTISOL: CORTISOL PLASMA: 19.9 ug/dL

## 2014-09-02 LAB — LACTIC ACID, PLASMA: LACTIC ACID, VENOUS: 1.8 mmol/L (ref 0.5–2.0)

## 2014-09-02 LAB — CREATININE, URINE, RANDOM: Creatinine, Urine: 104.8 mg/dL

## 2014-09-02 LAB — SODIUM, URINE, RANDOM: Sodium, Ur: 24 mmol/L — ABNORMAL LOW (ref 28–272)

## 2014-09-02 MED ORDER — ENOXAPARIN SODIUM 40 MG/0.4ML ~~LOC~~ SOLN
40.0000 mg | SUBCUTANEOUS | Status: DC
  Administered 2014-09-02 – 2014-09-06 (×5): 40 mg via SUBCUTANEOUS
  Filled 2014-09-02 (×5): qty 0.4

## 2014-09-02 MED ORDER — ENSURE ENLIVE PO LIQD
237.0000 mL | Freq: Two times a day (BID) | ORAL | Status: DC
  Administered 2014-09-02 – 2014-09-07 (×9): 237 mL via ORAL

## 2014-09-02 MED ORDER — SODIUM CHLORIDE 0.9 % IV SOLN
500.0000 mg | Freq: Three times a day (TID) | INTRAVENOUS | Status: DC
  Administered 2014-09-02 – 2014-09-03 (×4): 500 mg via INTRAVENOUS
  Filled 2014-09-02 (×5): qty 500

## 2014-09-02 NOTE — Telephone Encounter (Signed)
Daughter would like you to know pt has been admitted to [facility] for dehydration  and high calcium levels Pt also been dx w/ stage 4 lung cancer and chemo has been recommended

## 2014-09-02 NOTE — Progress Notes (Signed)
[facility] for Vancomycin, Primaxin Indication: postobstructive PNA, sepsis  Allergies  Allergen Reactions  . Amoxicillin     rash    Patient Measurements: Height: '5\' 9"'$  (175.3 cm) Weight: 208 lb 1.8 oz (94.4 kg) IBW/kg (Calculated) : 70.7  Vital Signs: Temp: 98 F (36.7 C) (08/16 0520) Temp Source: Oral (08/16 0520) BP: 110/44 mmHg (08/16 0520) Pulse Rate: 87 (08/16 0520) Intake/Output from previous day: 08/15 0701 - 08/16 0700 In: 2513.3 [P.O.:600; I.V.:803.3; IV Piggyback:1110] Out: 870 [Urine:870] Intake/Output from this shift:    Labs:  Recent Labs  09/01/14 1432 09/01/14 2050 09/02/14 0105  WBC 16.0* 15.0* 15.0*  HGB 9.4* 9.0* 9.0*  PLT 305 284 275  CREATININE 1.6* 1.65* 1.45*   Estimated Creatinine Clearance: 47.6 mL/min (by C-G formula based on Cr of 1.45). No results for input(s): VANCOTROUGH, VANCOPEAK, VANCORANDOM, GENTTROUGH, GENTPEAK, GENTRANDOM, TOBRATROUGH, TOBRAPEAK, TOBRARND, AMIKACINPEAK, AMIKACINTROU, AMIKACIN in the last 72 hours.   Microbiology: Recent Results (from the past 720 hour(s))  Culture, blood (routine x 2) Call MD if unable to obtain prior to antibiotics being given     Status: None (Preliminary result)   Collection Time: 09/01/14  9:55 PM  Result Value Ref Range Status   Specimen Description   Final    BLOOD RIGHT HAND Performed at [facility]  Final   Culture PENDING  Incomplete   Report Status PENDING  Incomplete  Culture, blood (routine x 2) Call MD if unable to obtain prior to antibiotics being given     Status: None (Preliminary result)   Collection Time: 09/01/14 10:00 PM  Result Value Ref Range Status   Specimen Description   Final    BLOOD RIGHT ANTECUBITAL Performed at [facility]  Final   Culture PENDING  Incomplete   Report Status PENDING  Incomplete     Medical History: Past Medical History  Diagnosis Date  . CAD (coronary artery disease)     sees Dr. Dani Gobble Croitoru   . Hypertension   . Hyperlipidemia   . Rheumatic fever "I was an infant"  . Hyperglycemia   . BPH (benign prostatic hyperplasia)     sees Dr. Junious Silk   . Heart murmur dx'd 1958  . Type II diabetes mellitus dx'd ~ 04/2014    "mild; advised by MD to lose weight; no RX" (07/30/2014)  . History of gout   . Kidney carcinoma 1999    sees Dr. Junious Silk   . Malnutrition related to chronic disease 08/21/2014    Medications:  Anti-infectives    Start     Dose/Rate Route Frequency Ordered Stop   09/02/14 0800  vancomycin (VANCOCIN) IVPB 750 mg/150 ml premix     750 mg 150 mL/hr over 60 Minutes Intravenous Every 12 hours 09/01/14 2058     09/02/14 0600  imipenem-cilastatin (PRIMAXIN) 250 mg in sodium chloride 0.9 % 100 mL IVPB     250 mg 200 mL/hr over 30 Minutes Intravenous 4 times per day 09/01/14 2058     09/01/14 2130  vancomycin (VANCOCIN) 1,500 mg in sodium chloride 0.9 % 500 mL IVPB     1,500 mg 250 mL/hr over 120 Minutes Intravenous  Once 09/01/14 2058 09/02/14 0151   09/01/14 2130  imipenem-cilastatin (PRIMAXIN) 500 mg in sodium chloride 0.9 % 100 mL IVPB     500 mg 200 mL/hr over  30 Minutes Intravenous  Once 09/01/14 2058 09/01/14 2329     Assessment: 80 y.o. male with stage IV lung cancer about to start chemo, admitted 09/01/2014 from oncologist's office for hypercalcemia.  Reports chills but no fever; meets sepsis criteria with CXR concerning for postobstructive pneumonia.  Pharmacy to dose vancomycin and Primaxin (given rash to Amox and desired anaerobic coverage).  WBC remains elevated. CKD at baseline, Scr trending down.   Goal of Therapy:  Vancomycin trough level 15-20 mcg/ml  Eradication of infection Appropriate antibiotic dosing for indication and renal function  Plan:  Day 1 antibiotics Vancomycin 1500 mg IV now, then 750 mg IV q12 hr Measure  vancomycin trough levels at steady state as indicated Primaxin 500 mg IV q8h  Follow clinical course, renal function, culture results as available  Follow for de-escalation of antibiotics and LOT  Netta Cedars, PharmD, BCPS Pager: [phone] 09/02/2014, 11:19 AM

## 2014-09-02 NOTE — Progress Notes (Signed)
Initial Nutrition Assessment  DOCUMENTATION CODES:   Non-severe (moderate) malnutrition in context of acute illness/injury, Obesity unspecified  INTERVENTION:  - Will order Ensure Enlive po BID, each supplement provides 350 kcal and 20 grams of protein - RD will continue to monitor for needs  NUTRITION DIAGNOSIS:   Increased nutrient needs related to catabolic illness, cancer and cancer related treatments as evidenced by estimated needs.  GOAL:   Patient will meet greater than or equal to 90% of their needs  MONITOR:   PO intake, Supplement acceptance, Weight trends, Labs, I & O's  REASON FOR ASSESSMENT:   Malnutrition Screening Tool, Consult Assessment of nutrition requirement/status  ASSESSMENT:   Patient was recently diagnosed with metastatic Lung Cancer (stage IV non-small cell lung cancer with mediastinal lymphadenopathy metastatic disease to peritoneum and bones) under care of Dr. Julien Nordmann With plan to start chemo next week.  Pt seen for MST and consult. BMI indicates obesity. No intakes documented. Pt reports he ate a few bites of sausage and egg for breakfast this AM. Visualized lunch tray with 1 small bite each taken of angel food cake and also sandwich. Pt mainly responds with "yes" and "no." Pt was sleeping when RD entered the room and had to be woken up so this may account for his minimalist responses.  He indicates that he has not been feeling hungry lately. He denies abdominal pain or nausea after minimal intakes today; MD note indicated emesis following all PO intakes PTA. Pt indicates that he was drinking Ensure PTA and he is interested in it being ordered for use during admission. Will monitor for intakes of meals and supplements and any associated emesis.   Pt unsure of weight changes PTA. Per weight hx review, pt has lost 9 lbs (4% body weight) in the past 11 days which is significant for time frame. Mild muscle wasting noted to upper body.   Not meeting needs.  Medications reviewed. Labs reviewed; CBGs: 101 and 109 mg/dL, BUN/creatinine elevated, Ca: 12.1 mg/dL, GFR: 45.    Diet Order:  Diet Carb Modified Fluid consistency:: Thin; Room service appropriate?: Yes  Skin:  Wound (see comment) (Stage 2 L buttocks pressure ulcer, L back wound)  Last BM:  8/13  Height:   Ht Readings from Last 1 Encounters:  09/01/14 '5\' 9"'$  (1.753 m)    Weight:   Wt Readings from Last 1 Encounters:  09/01/14 208 lb 1.8 oz (94.4 kg)    Ideal Body Weight:  70 kg (kg)  BMI:  Body mass index is 30.72 kg/(m^2).  Estimated Nutritional Needs:   Kcal:  7014-1030  Protein:  110-120 grams  Fluid:  1.5-1.8 L/day  EDUCATION NEEDS:   No education needs identified at this time     Jarome Matin, RD, LDN Inpatient Clinical Dietitian Pager # [phone] After hours/weekend pager # [phone]

## 2014-09-02 NOTE — Telephone Encounter (Signed)
Called and left a message with all appointments per pof  anne

## 2014-09-02 NOTE — Telephone Encounter (Signed)
Noted  

## 2014-09-02 NOTE — Telephone Encounter (Signed)
Oncology Nurse Navigator Documentation  Oncology Nurse Navigator Flowsheets 09/02/2014  Navigator Encounter Type Other/saw patient in the hospital today.  He seems like he has more energy today.  He stated he was feeling better.  I left healthcare power of attorney papers with him.  I called his daughter to let her know they are with him, I left a vm message.    Patient Visit Type Inpatient  Treatment Phase Abnormal Scans  Interventions Other  Time Spent with Patient 30

## 2014-09-02 NOTE — Progress Notes (Signed)
Oncology Nurse Navigator Documentation  Oncology Nurse Navigator Flowsheets 09/02/2014  Navigator Encounter Type Telephone/Patient's daughter called.  She had some questions about treatment and her dad's diagnosis.  I spoke with her for 30 minutes addressing all questions and concerns.    Patient Visit Type Follow-up  Treatment Phase Treatment  Barriers/Navigation Needs Education  Education Coping with Diagnosis/ Prognosis;Newly Diagnosed Cancer Education  Interventions Coordination of Care  Coordination of Care MD Appointments  Education Method Verbal  Time Spent with Patient 30

## 2014-09-02 NOTE — Telephone Encounter (Signed)
Patient's daughter called and left me a vm message to call.  I called and left her a vm message to call.

## 2014-09-02 NOTE — Care Management Note (Signed)
Case Management Note  Patient Details  Name: John Potter MRN: [id number] Date of Birth: 1935-01-02  Subjective/Objective:   80 y/o m admitted w/hypercalcemia/Hx:NSCLCA,Sarcomatoid Ca, met bone.From home.   Would recommend PT cons. Noted received cons for palliative cons-MD would need to put palliative cons order in.              Action/Plan:d/c plan home.   Expected Discharge Date:   (unknown)               Expected Discharge Plan:  [place]  In-House Referral:     Discharge planning Services  CM Consult  Post Acute Care Choice:    Choice offered to:     DME Arranged:    DME Agency:     HH Arranged:    HH Agency:     Status of Service:  In process, will continue to follow  Medicare Important Message Given:    Date Medicare IM Given:    Medicare IM give by:    Date Additional Medicare IM Given:    Additional Medicare Important Message give by:     If discussed at [place] of Stay Meetings, dates discussed:    Additional Comments:  Dessa Phi, RN 09/02/2014, 1:01 PM

## 2014-09-02 NOTE — Progress Notes (Signed)
[facility] PROGRESS NOTE  John Potter [id number]:[id number] DOB: April 13, 1934 DOA: 09/01/2014  PCP: Laurey Morale, MD  Brief HPI: 80 year old Caucasian male with a past medical history of coronary artery disease, hypertension, type 2 diabetes, recently diagnosed with metastatic lung cancer. He was supposed to start chemotherapy next week. He presented with worsening confusion. He was noted to have hypercalcemia. He was dehydrated with hypotension. He was hospitalized for further management.  Past medical history:  Past Medical History  Diagnosis Date  . CAD (coronary artery disease)     sees Dr. Dani Gobble Croitoru   . Hypertension   . Hyperlipidemia   . Rheumatic fever "I was an infant"  . Hyperglycemia   . BPH (benign prostatic hyperplasia)     sees Dr. Junious Silk   . Heart murmur dx'd 1958  . Type II diabetes mellitus dx'd ~ 04/2014    "mild; advised by MD to lose weight; no RX" (07/30/2014)  . History of gout   . Kidney carcinoma 1999    sees Dr. Junious Silk   . Malnutrition related to chronic disease 08/21/2014    Consultants: None. Oncology aware.  Procedures: None  Antibiotics: Vancomycin and imipenem  Subjective: Patient feels somewhat better this morning. Not as confused per his daughter who is at bedside. Still has generalized weakness. Denies any pain, nausea, vomiting.  Objective: Vital Signs  Filed Vitals:   09/01/14 1823 09/01/14 1959 09/02/14 0520 09/02/14 1500  BP: 106/46 133/54 110/44 124/51  Pulse: 75 78 87 84  Temp:  97.8 F (36.6 C) 98 F (36.7 C) 98.7 F (37.1 C)  TempSrc:  Oral Oral Oral  Resp: '18 18 18 20  '$ Height:  '5\' 9"'$  (1.753 m)    Weight:  94.4 kg (208 lb 1.8 oz)    SpO2: 95% 94% 94% 92%    Intake/Output Summary (Last 24 hours) at 09/02/14 1557 Last data filed at 09/02/14 0700  Gross per 24 hour  Intake 2513.33 ml  Output    870 ml  Net 1643.33 ml   Filed Weights   09/01/14 1959  Weight: 94.4 kg (208 lb 1.8 oz)    General appearance:  alert, cooperative, distracted and no distress Resp: clear to auscultation bilaterally Cardio: regular rate and rhythm, S1, S2 normal, no murmur, click, rub or gallop GI: soft, non-tender; bowel sounds normal; no masses,  no organomegaly Extremities: extremities normal, atraumatic, no cyanosis or edema Neurologic: Alert. Somewhat distracted. Oriented to person, place. No focal neurological deficits.  Lab Results:  Basic Metabolic Panel:  Recent Labs Lab 09/01/14 1432 09/01/14 2050 09/02/14 0105  NA 142 139 140  K 5.0 5.1 4.9  CL  --  104 107  CO2 '29 30 28  '$ GLUCOSE 120 121* 126*  BUN 50.0* 51* 49*  CREATININE 1.6* 1.65* 1.45*  CALCIUM 14.0 Repeated and Verified* 12.7* 12.1*   Liver Function Tests:  Recent Labs Lab 09/01/14 1432 09/01/14 2050 09/02/14 0105  AST 60* 62* 57*  ALT 61* 55 52  ALKPHOS 84 73 69  BILITOT 0.35 0.5 0.6  PROT 6.2* 6.2* 6.1*  ALBUMIN 1.9* 2.2* 2.0*    Recent Labs Lab 09/01/14 1637  LIPASE 42   CBC:  Recent Labs Lab 09/01/14 1432 09/01/14 2050 09/02/14 0105  WBC 16.0* 15.0* 15.0*  NEUTROABS 12.9* 12.3* 12.3*  HGB 9.4* 9.0* 9.0*  HCT 30.9* 30.6* 30.4*  MCV 84.4 85.7 86.1  PLT 305 284 275   BNP (last 3 results)  Recent Labs  07/30/14 1204  BNP 124.1*    CBG:  Recent Labs Lab 09/01/14 2238 09/02/14 0809 09/02/14 1207  GLUCAP 109* 101* 118*    Recent Results (from the past 240 hour(s))  Culture, blood (routine x 2) Call MD if unable to obtain prior to antibiotics being given     Status: None (Preliminary result)   Collection Time: 09/01/14  9:55 PM  Result Value Ref Range Status   Specimen Description   Final    BLOOD RIGHT HAND Performed at [place]  Final   Culture PENDING  Incomplete   Report Status PENDING  Incomplete  Culture, blood (routine x 2) Call MD if unable to obtain prior to antibiotics being given     Status: None (Preliminary result)    Collection Time: 09/01/14 10:00 PM  Result Value Ref Range Status   Specimen Description   Final    BLOOD RIGHT ANTECUBITAL Performed at [place]  Final   Culture PENDING  Incomplete   Report Status PENDING  Incomplete      Studies/Results: Dg Chest 2 View  09/01/2014   CLINICAL DATA:  Altered mental status. SOB. No fever. Pt takes HTN meds. Borderline diabetic. Quit smoking in 1975. Smoked for 20 years prior to quitting  EXAM: CHEST  2 VIEW  COMPARISON:  08/19/2014  FINDINGS: Left lower to mid lung zone opacity reflects a combination of the known left lower lobe central mass and postobstructive atelectasis/consolidation. There is also a loculated pleural fluid on the left as well as hazy parenchymal opacity and irregular interstitial thickening. These findings are similar to the most recent prior exam.  Right lung is clear.  No right pleural effusion.  There are changes from previous cardiac surgery, stable. Cardiac silhouette is partly obscured by the contiguous left lung and pleural opacity. It appears mildly enlarged but stable.  No pneumothorax.  Multiple left upper quadrant vascular clips, stable.  Bony thorax is grossly intact.  IMPRESSION: 1. No significant change from the recent prior study. 2. Persistent findings on the left related to the left lower lobe central mass and postobstructive left lower lobe atelectasis/consolidation as well as loculated areas of left pleural fluid and areas of irregular interstitial thickening.   Electronically Signed   By: Lajean Manes M.D.   On: 09/01/2014 17:52    Medications:  Scheduled: . aspirin EC  81 mg Oral QPM  . calcitonin  400 Units Subcutaneous Q12H  . enoxaparin (LOVENOX) injection  40 mg Subcutaneous Q24H  . feeding supplement (ENSURE ENLIVE)  237 mL Oral BID BM  . imipenem-cilastatin  500 mg Intravenous 3 times per day  . insulin aspart  0-5 Units Subcutaneous QHS  . insulin  aspart  0-9 Units Subcutaneous TID WC  . simvastatin  20 mg Oral QHS  . tamsulosin  0.4 mg Oral Daily  . vancomycin  750 mg Intravenous Q12H   Continuous: . sodium chloride 100 mL/hr at 09/02/14 1559   PRN:  Assessment/Plan:  Active Problems:   CAD (coronary artery disease)   Essential hypertension   CKD (chronic kidney disease) stage 3, GFR 30-59 ml/min   Lung mass   Hypercalcemia   Pneumonia   Sepsis   Malignant neoplasm of lower lobe of left lung   Pressure ulcer   Malnutrition of moderate degree    Acute Encephalopathy Secondary to hypercalcemia. Seems to be improving. No  focal deficits. Continue to monitor. Reorient daily.  Hypercalcemia of malignancy Patient has been given fluids and bisphosphonate. Also given calcitonin. Calcium level appears to be improving. Continue with hydration. Repeat calcium levels in the morning.  Recently diagnosed stage IV lung cancer Supposed to start chemotherapy next week. His oncologist is aware of this hospitalization.  Sepsis with possible pneumonia Postobstructive pneumonia appreciated on chest x-ray, though this could have been a chronic finding. His WBC was elevated. Patient started on broad-spectrum coverage. Follow-up culture reports. De-escalate as soon as possible.  History of chronic kidney disease stage III Creatinine is stable. Continue with hydration. Holding lisinopril and Lasix.  History of coronary artery disease Currently stable.  Diabetes mellitus type 2 Monitor CBGs. Sliding scale coverage.  Essential hypertension Patient was hypotensive when he was seen in the emergency department. Blood pressures have improved. Continue to hold his antihypertensives agents.  Normocytic anemia Could be due to chronic disease. Hemoglobin is stable. Continue to monitor.   DVT Prophylaxis: Lovenox    Code Status: DO NOT RESUSCITATE  Family Communication: Discussed with the patient and his daughter  Disposition Plan:  Continue current management.    LOS: 1 day   [facility] Pager [phone] 09/02/2014, 3:57 PM  If 7PM-7AM, please contact night-coverage at [email or web address], password [facility]

## 2014-09-03 DIAGNOSIS — I10 Essential (primary) hypertension: Secondary | ICD-10-CM

## 2014-09-03 DIAGNOSIS — E440 Moderate protein-calorie malnutrition: Secondary | ICD-10-CM

## 2014-09-03 DIAGNOSIS — C3432 Malignant neoplasm of lower lobe, left bronchus or lung: Secondary | ICD-10-CM

## 2014-09-03 DIAGNOSIS — A419 Sepsis, unspecified organism: Principal | ICD-10-CM

## 2014-09-03 DIAGNOSIS — L8990 Pressure ulcer of unspecified site, unspecified stage: Secondary | ICD-10-CM

## 2014-09-03 DIAGNOSIS — N183 Chronic kidney disease, stage 3 (moderate): Secondary | ICD-10-CM

## 2014-09-03 LAB — COMPREHENSIVE METABOLIC PANEL
ALBUMIN: 1.9 g/dL — AB (ref 3.5–5.0)
ALT: 42 U/L (ref 17–63)
ANION GAP: 8 (ref 5–15)
AST: 54 U/L — AB (ref 15–41)
Alkaline Phosphatase: 63 U/L (ref 38–126)
BILIRUBIN TOTAL: 0.6 mg/dL (ref 0.3–1.2)
BUN: 38 mg/dL — AB (ref 6–20)
CHLORIDE: 109 mmol/L (ref 101–111)
CO2: 24 mmol/L (ref 22–32)
Calcium: 11.1 mg/dL — ABNORMAL HIGH (ref 8.9–10.3)
Creatinine, Ser: 1.4 mg/dL — ABNORMAL HIGH (ref 0.61–1.24)
GFR calc Af Amer: 54 mL/min — ABNORMAL LOW (ref >60)
GFR calc non Af Amer: 47 mL/min — ABNORMAL LOW (ref >60)
GLUCOSE: 119 mg/dL — AB (ref 65–99)
POTASSIUM: 4.6 mmol/L (ref 3.5–5.1)
Sodium: 141 mmol/L (ref 135–145)
TOTAL PROTEIN: 5.6 g/dL — AB (ref 6.5–8.1)

## 2014-09-03 LAB — MAGNESIUM: MAGNESIUM: 1.9 mg/dL (ref 1.7–2.4)

## 2014-09-03 LAB — GLUCOSE, CAPILLARY
GLUCOSE-CAPILLARY: 167 mg/dL — AB (ref 65–99)
GLUCOSE-CAPILLARY: 161 mg/dL — AB (ref 65–99)
Glucose-Capillary: 111 mg/dL — ABNORMAL HIGH (ref 65–99)
Glucose-Capillary: 152 mg/dL — ABNORMAL HIGH (ref 65–99)

## 2014-09-03 LAB — CALCIUM, IONIZED
Calcium, Ionized, Serum: 7.8 mg/dL — ABNORMAL HIGH (ref 4.5–5.6)
Calcium, Ionized, Serum: 7.4 mg/dL — ABNORMAL HIGH (ref 4.5–5.6)

## 2014-09-03 LAB — CBC
HCT: 27.3 % — ABNORMAL LOW (ref 39.0–52.0)
Hemoglobin: 8 g/dL — ABNORMAL LOW (ref 13.0–17.0)
MCH: 25.2 pg — ABNORMAL LOW (ref 26.0–34.0)
MCHC: 29.3 g/dL — AB (ref 30.0–36.0)
MCV: 85.8 fL (ref 78.0–100.0)
PLATELETS: 253 10*3/uL (ref 150–400)
RBC: 3.18 MIL/uL — ABNORMAL LOW (ref 4.22–5.81)
RDW: 16.4 % — AB (ref 11.5–15.5)
WBC: 15.1 10*3/uL — AB (ref 4.0–10.5)

## 2014-09-03 LAB — PHOSPHORUS: Phosphorus: 2.4 mg/dL — ABNORMAL LOW (ref 2.5–4.6)

## 2014-09-03 LAB — PROCALCITONIN: PROCALCITONIN: 0.2 ng/mL

## 2014-09-03 LAB — TSH: TSH: 0.92 u[IU]/mL (ref 0.350–4.500)

## 2014-09-03 LAB — HEMOGLOBIN A1C
HEMOGLOBIN A1C: 6.9 % — AB (ref 4.8–5.6)
MEAN PLASMA GLUCOSE: 151 mg/dL

## 2014-09-03 MED ORDER — LEVOFLOXACIN IN D5W 750 MG/150ML IV SOLN
750.0000 mg | INTRAVENOUS | Status: DC
  Administered 2014-09-03 – 2014-09-04 (×2): 750 mg via INTRAVENOUS
  Filled 2014-09-03 (×2): qty 150

## 2014-09-03 MED ORDER — FUROSEMIDE 40 MG PO TABS: 40.0000 mg | ORAL_TABLET | ORAL | Status: DC

## 2014-09-03 MED ORDER — FUROSEMIDE 40 MG PO TABS
40.0000 mg | ORAL_TABLET | Freq: Every day | ORAL | Status: DC
  Administered 2014-09-03 – 2014-09-04 (×2): 40 mg via ORAL
  Filled 2014-09-03 (×2): qty 1

## 2014-09-03 NOTE — Care Management Important Message (Signed)
Important Message  Patient Details  Name: John Potter MRN: [id number] Date of Birth: September 16, 1934   Medicare Important Message Given:  Yes-second notification given    Camillo Flaming 09/03/2014, 1:13 [place] Message  Patient Details  Name: John Potter MRN: [id number] Date of Birth: 01-Feb-1934   Medicare Important Message Given:  Yes-second notification given    Camillo Flaming 09/03/2014, 1:13 PM

## 2014-09-03 NOTE — Progress Notes (Signed)
OT Cancellation Note  Patient Details Name: John Potter MRN: [id number] DOB: Jun 09, 1934   Cancelled Treatment:    Reason Eval/Treat Not Completed: Fatigue/lethargy limiting ability to participate  Will check on pt next day.  Betsy Pries 09/03/2014, 2:27 PM

## 2014-09-03 NOTE — Progress Notes (Signed)
[facility] PROGRESS NOTE  John Potter VZC:[id number] DOB: 14-Mar-1934 DOA: 09/01/2014  PCP: Laurey Morale, MD  Brief HPI: 80 year old Caucasian male with a past medical history of coronary artery disease, hypertension, type 2 diabetes, recently diagnosed with metastatic lung cancer. He was supposed to start chemotherapy next week. He presented with worsening confusion. He was noted to have hypercalcemia. He was dehydrated with hypotension. He was hospitalized for further management.  Past medical history:  Past Medical History  Diagnosis Date  . CAD (coronary artery disease)     sees Dr. Dani Gobble Croitoru   . Hypertension   . Hyperlipidemia   . Rheumatic fever "I was an infant"  . Hyperglycemia   . BPH (benign prostatic hyperplasia)     sees Dr. Junious Silk   . Heart murmur dx'd 1958  . Type II diabetes mellitus dx'd ~ 04/2014    "mild; advised by MD to lose weight; no RX" (07/30/2014)  . History of gout   . Kidney carcinoma 1999    sees Dr. Junious Silk   . Malnutrition related to chronic disease 08/21/2014    Consultants: None. Oncology aware.  Procedures: None  Antibiotics: Vancomycin and imipenem  Subjective: No confusion answers all orienting question appropriately sats 89% when off O2 tol only 20% diet No diarr No f/chill/cp   Objective: Vital Signs  Filed Vitals:   09/02/14 1500 09/02/14 2046 09/03/14 0501 09/03/14 1500  BP: 124/51 128/56 124/49 126/48  Pulse: 84 87 96 77  Temp: 98.7 F (37.1 C) 98.5 F (36.9 C) 98.2 F (36.8 C) 98 F (36.7 C)  TempSrc: Oral Oral Oral Oral  Resp: '20 18 20 18  '$ Height:      Weight:      SpO2: 92% 95% 92% 94%    Intake/Output Summary (Last 24 hours) at 09/03/14 1615 Last data filed at 09/03/14 1224  Gross per 24 hour  Intake   4200 ml  Output    950 ml  Net   3250 ml   Filed Weights   09/01/14 1959  Weight: 94.4 kg (208 lb 1.8 oz)    General appearance: alert, cooperative, distracted and no distress-slightly  SOB Resp: clear to auscultation bilaterally Cardio: regular rate and rhythm, S1, S2 normal, no murmur, click, rub or gallop GI: soft, non-tender; bowel sounds normal; no masses,  no organomegaly Extremities: extremities normal, atraumatic, no cyanosis or edema   Lab Results:  Basic Metabolic Panel:  Recent Labs Lab 09/01/14 1432 09/01/14 2050 09/02/14 0105 09/03/14 0419  NA 142 139 140 141  K 5.0 5.1 4.9 4.6  CL  --  104 107 109  CO2 '29 30 28 24  '$ GLUCOSE 120 121* 126* 119*  BUN 50.0* 51* 49* 38*  CREATININE 1.6* 1.65* 1.45* 1.40*  CALCIUM 14.0 Repeated and Verified* 12.7* 12.1* 11.1*  MG  --   --   --  1.9  PHOS  --   --   --  2.4*   Liver Function Tests:  Recent Labs Lab 09/01/14 1432 09/01/14 2050 09/02/14 0105 09/03/14 0419  AST 60* 62* 57* 54*  ALT 61* 55 52 42  ALKPHOS 84 73 69 63  BILITOT 0.35 0.5 0.6 0.6  PROT 6.2* 6.2* 6.1* 5.6*  ALBUMIN 1.9* 2.2* 2.0* 1.9*    Recent Labs Lab 09/01/14 1637  LIPASE 42   CBC:  Recent Labs Lab 09/01/14 1432 09/01/14 2050 09/02/14 0105 09/03/14 0419  WBC 16.0* 15.0* 15.0* 15.1*  NEUTROABS 12.9* 12.3* 12.3*  --  HGB 9.4* 9.0* 9.0* 8.0*  HCT 30.9* 30.6* 30.4* 27.3*  MCV 84.4 85.7 86.1 85.8  PLT 305 284 275 253   BNP (last 3 results)  Recent Labs  07/30/14 1204  BNP 124.1*    CBG:  Recent Labs Lab 09/02/14 1207 09/02/14 1822 09/02/14 2051 09/03/14 0729 09/03/14 1147  GLUCAP 118* 104* 136* 111* 167*    Recent Results (from the past 240 hour(s))  Culture, blood (routine x 2) Call MD if unable to obtain prior to antibiotics being given     Status: None (Preliminary result)   Collection Time: 09/01/14  9:55 PM  Result Value Ref Range Status   Specimen Description BLOOD RIGHT HAND  Final   Special Requests BOTTLES DRAWN AEROBIC ONLY 5CC  Final   Culture   Final    NO GROWTH 1 DAY Performed at [facility]    Report Status PENDING  Incomplete  Culture, blood (routine x 2) Call MD if  unable to obtain prior to antibiotics being given     Status: None (Preliminary result)   Collection Time: 09/01/14 10:00 PM  Result Value Ref Range Status   Specimen Description BLOOD RIGHT ANTECUBITAL  Final   Special Requests BOTTLES DRAWN AEROBIC ONLY 5CC  Final   Culture   Final    NO GROWTH 1 DAY Performed at [facility]    Report Status PENDING  Incomplete      Studies/Results: Dg Chest 2 View  09/01/2014   CLINICAL DATA:  Altered mental status. SOB. No fever. Pt takes HTN meds. Borderline diabetic. Quit smoking in 1975. Smoked for 20 years prior to quitting  EXAM: CHEST  2 VIEW  COMPARISON:  08/19/2014  FINDINGS: Left lower to mid lung zone opacity reflects a combination of the known left lower lobe central mass and postobstructive atelectasis/consolidation. There is also a loculated pleural fluid on the left as well as hazy parenchymal opacity and irregular interstitial thickening. These findings are similar to the most recent prior exam.  Right lung is clear.  No right pleural effusion.  There are changes from previous cardiac surgery, stable. Cardiac silhouette is partly obscured by the contiguous left lung and pleural opacity. It appears mildly enlarged but stable.  No pneumothorax.  Multiple left upper quadrant vascular clips, stable.  Bony thorax is grossly intact.  IMPRESSION: 1. No significant change from the recent prior study. 2. Persistent findings on the left related to the left lower lobe central mass and postobstructive left lower lobe atelectasis/consolidation as well as loculated areas of left pleural fluid and areas of irregular interstitial thickening.   Electronically Signed   By: Lajean Manes M.D.   On: 09/01/2014 17:52    Medications:  Scheduled: . aspirin EC  81 mg Oral QPM  . calcitonin  400 Units Subcutaneous Q12H  . enoxaparin (LOVENOX) injection  40 mg Subcutaneous Q24H  . feeding supplement (ENSURE ENLIVE)  237 mL Oral BID BM  . imipenem-cilastatin   500 mg Intravenous 3 times per day  . insulin aspart  0-5 Units Subcutaneous QHS  . insulin aspart  0-9 Units Subcutaneous TID WC  . simvastatin  20 mg Oral QHS  . tamsulosin  0.4 mg Oral Daily  . vancomycin  750 mg Intravenous Q12H   Continuous: . sodium chloride 100 mL/hr at 09/03/14 0214   PRN:  Assessment/Plan:  Active Problems:   CAD (coronary artery disease)   Essential hypertension   CKD (chronic kidney disease) stage 3, GFR  30-59 ml/min   Lung mass   Hypercalcemia   Pneumonia   Sepsis   Malignant neoplasm of lower lobe of left lung   Pressure ulcer   Malnutrition of moderate degree    Acute Encephalopathy Secondary to hypercalcemia.   improving.  No focal deficits. Continue to monitor. Reorient daily.  Hypercalcemia of malignancy Patient has been given fluids and bisphosphonate. Continue with hydration saline 100-->50 cc/hr  Also given calcitonin.  Calcium level appears to be improving.  Give Lasix for hypercalcemia as will cause calicuresis  Recently diagnosed stage IV lung cancer Supposed to start chemotherapy next week. His oncologist is aware of this hospitalization.  Sepsis with possible pneumonia Postobstructive pneumonia appreciated on chest x-ray, though this could have been a chronic finding.  His WBC was elevated.  Patient started on broad-spectrum coverage 8/15  De-escalated from Primaxin/Vanc-->Levaquin 8/17  History of chronic kidney disease stage III Creatinine is stable. Continue with hydration. Holding lisinopril  History of coronary artery disease Currently stable.  Diabetes mellitus type 2 Monitor CBGs. Sliding scale coverage. Not eating Liberalize diet  Stage 1 Pressure ulcer Turn q 2  Essential hypertension Patient was hypotensive when he was seen in the emergency department.  Blood pressures have improved.  hold his antihypertensives agents. For now  Normocytic anemia Could be due to chronic disease. Hemoglobin is  stable. Continue to monitor.   DVT Prophylaxis: Lovenox    Code Status: DO NOT RESUSCITATE  Family Communication: Discussed with the patient and his daughter  Disposition Plan: Continue current management.    LOS: 2 days   Verneita Griffes, MD [facility] [phone]

## 2014-09-03 NOTE — Clinical Documentation Improvement (Signed)
Hospitalist Can the diagnosis of pressure ulcer be further specified?    Document if pressure ulcer with stage is Present on Admission   Document Site with laterality - Elbow, Back (upper/lower), Sacral, Hip, Buttock, Ankle, Heel, Head, Other (Specify)  Pressure Ulcer Stage - Stage1, Stage 2, Stage 3, Stage 4, Unstageable, Unspecified, Unable to Clinically Determine  Other  Clinically Undetermined  Supporting Information: (As per notes) "Pressure ulcer"  Please exercise your independent, professional judgment when responding. A specific answer is not anticipated or expected.  Thank You, Alessandra Grout, RN, BSN, CCDS,Clinical Documentation Specialist:  [phone]  [phone]=[facility] [place]- Health Information Management

## 2014-09-03 NOTE — Progress Notes (Signed)
ANTIBIOTIC CONSULT NOTE - INITIAL  Pharmacy Consult for Levaquin Indication: CAP  Allergies  Allergen Reactions  . Amoxicillin     rash    Patient Measurements: Height: '5\' 9"'$  (175.3 cm) Weight: 208 lb 1.8 oz (94.4 kg) IBW/kg (Calculated) : 70.7 Adjusted Body Weight:   Vital Signs: Temp: 98 F (36.7 C) (08/17 1500) Temp Source: Oral (08/17 1500) BP: 126/48 mmHg (08/17 1500) Pulse Rate: 77 (08/17 1500) Intake/Output from previous day: 08/16 0701 - 08/17 0700 In: 3600 [P.O.:600; I.V.:2400; IV Piggyback:600] Out: 950 [Urine:950] Intake/Output from this shift: Total I/O In: 600 [P.O.:600] Out: 650 [Urine:650]  Labs:  Recent Labs  09/01/14 2050 09/01/14 2314 09/02/14 0105 09/03/14 0419  WBC 15.0*  --  15.0* 15.1*  HGB 9.0*  --  9.0* 8.0*  PLT 284  --  275 253  LABCREA  --  104.8  --   --   CREATININE 1.65*  --  1.45* 1.40*   Estimated Creatinine Clearance: 49.3 mL/min (by C-G formula based on Cr of 1.4). No results for input(s): VANCOTROUGH, VANCOPEAK, VANCORANDOM, GENTTROUGH, GENTPEAK, GENTRANDOM, TOBRATROUGH, TOBRAPEAK, TOBRARND, AMIKACINPEAK, AMIKACINTROU, AMIKACIN in the last 72 hours.   Microbiology: Recent Results (from the past 720 hour(s))  Culture, blood (routine x 2) Call MD if unable to obtain prior to antibiotics being given     Status: None (Preliminary result)   Collection Time: 09/01/14  9:55 PM  Result Value Ref Range Status   Specimen Description BLOOD RIGHT HAND  Final   Special Requests BOTTLES DRAWN AEROBIC ONLY 5CC  Final   Culture   Final    NO GROWTH 1 DAY Performed at [facility]    Report Status PENDING  Incomplete  Culture, blood (routine x 2) Call MD if unable to obtain prior to antibiotics being given     Status: None (Preliminary result)   Collection Time: 09/01/14 10:00 PM  Result Value Ref Range Status   Specimen Description BLOOD RIGHT ANTECUBITAL  Final   Special Requests BOTTLES DRAWN AEROBIC ONLY 5CC  Final   Culture   Final    NO GROWTH 1 DAY Performed at [facility]    Report Status PENDING  Incomplete    Medical History: Past Medical History  Diagnosis Date  . CAD (coronary artery disease)     sees Dr. Dani Gobble Croitoru   . Hypertension   . Hyperlipidemia   . Rheumatic fever "I was an infant"  . Hyperglycemia   . BPH (benign prostatic hyperplasia)     sees Dr. Junious Silk   . Heart murmur dx'd 1958  . Type II diabetes mellitus dx'd ~ 04/2014    "mild; advised by MD to lose weight; no RX" (07/30/2014)  . History of gout   . Kidney carcinoma 1999    sees Dr. Junious Silk   . Malnutrition related to chronic disease 08/21/2014   Assessment: 53 yoM on broad spectrum IV antibiotics for sepsis 2/2 possible PNA now to narrow to levaquin monotherapy.  Noted hx CKD -III. WBCs remain elevated.  SCr improved to 1.40, CrCl ~49 ml/min.  Afebrile.  Blood cultures NGTD.  QTc ok.   Goal of Therapy:  Eradication of infection  Plan:  Start levaquin '750mg'$  IV q24h.  F/u renal fxn to adjust dose.   Ralene Bathe, PharmD, BCPS 09/03/2014, 6:33 PM  Pager: [phone]

## 2014-09-03 NOTE — Evaluation (Signed)
Physical Therapy Evaluation Patient Details Name: John Potter MRN: [id number] DOB: 07-11-1934 Today's Date: 09/03/2014   History of Present Illness  80 year old Caucasian male with a past medical history of coronary artery disease, hypertension, type 2 diabetes, recently diagnosed with metastatic lung cancer. admitted with worsening confusion and found to have hypercalcemia  Clinical Impression  Pt admitted with above diagnosis. Pt currently with functional limitations due to the deficits listed below (see PT Problem List).  Pt will benefit from skilled PT to increase their independence and safety with mobility to allow discharge to the venue listed below.   Pt requiring 3L O2 for SpO2 90% during activity.  Pt fatigues quickly and requires assist for mobility and hygiene at this time.  Recommend ST-SNF upon d/c.     Follow Up Recommendations SNF;Supervision/Assistance - 24 hour    Equipment Recommendations  None recommended by PT    Recommendations for Other Services       Precautions / Restrictions Precautions Precautions: Fall Precaution Comments: monitor sats      Mobility  Bed Mobility Overal bed mobility: Needs Assistance Bed Mobility: Supine to Sit     Supine to sit: Min assist;HOB elevated     General bed mobility comments: assist for trunk upright, increased time and effort due to fatigue  Transfers Overall transfer level: Needs assistance Equipment used: Rolling walker (2 wheeled) Transfers: Sit to/from [facility] Sit to Stand: Min assist Stand pivot transfers: Min assist       General transfer comment: multimodal cues for safe technique, assist to rise and steady as well as control descent, attempted to take a couple steps however pt with BM urgency so pivoted to place BSC behind pt, required 3L O2 SPO2 90%  Ambulation/Gait                Stairs            Wheelchair Mobility    Modified Rankin (Stroke Patients Only)        Balance Overall balance assessment: Needs assistance;History of Falls         Standing balance support: Bilateral upper extremity supported;During functional activity Standing balance-Leahy Scale: Poor                               Pertinent Vitals/Pain Pain Assessment: No/denies pain    Home Living Family/patient expects to be discharged to:: Private residence Living Arrangements: Alone Available Help at Discharge: Family Type of Home: House       Home Layout: One level Home Equipment: Environmental consultant - 2 wheels;Bedside commode      Prior Function Level of Independence: Independent               Hand Dominance        Extremity/Trunk Assessment   Upper Extremity Assessment: Generalized weakness           Lower Extremity Assessment: Generalized weakness         Communication   Communication: No difficulties  Cognition Arousal/Alertness: Awake/alert Behavior During Therapy: WFL for tasks assessed/performed Overall Cognitive Status: Within Functional Limits for tasks assessed (appropriate during session, per RN confusion better)                      General Comments General comments (skin integrity, edema, etc.): R knee skin tear from falling out of bed prior to admission per daughter    Exercises  Assessment/Plan    PT Assessment Patient needs continued PT services  PT Diagnosis Difficulty walking;Generalized weakness   PT Problem List Decreased strength;Decreased activity tolerance;Decreased knowledge of use of DME;Decreased balance;Decreased safety awareness;Decreased mobility;Cardiopulmonary status limiting activity  PT Treatment Interventions DME instruction;Gait training;Functional mobility training;Patient/family education;Therapeutic activities;Therapeutic exercise;Balance training   PT Goals (Current goals can be found in the Care Plan section) Acute Rehab PT Goals PT Goal Formulation: With patient Time For  Goal Achievement: 09/17/14 Potential to Achieve Goals: Good    Frequency Min 3X/week   Barriers to discharge        Co-evaluation               End of Session Equipment Utilized During Treatment: Gait belt;Oxygen Activity Tolerance: Patient limited by fatigue Patient left: in chair;with call bell/phone within reach;with chair alarm set Nurse Communication: Mobility status         Time: 8337-4451 PT Time Calculation (min) (ACUTE ONLY): 25 min   Charges:   PT Evaluation $Initial PT Evaluation Tier I: 1 Procedure PT Treatments $Therapeutic Activity: 8-22 mins   PT G Codes:        Murielle Stang,KATHrine E 09/03/2014, 12:02 PM Carmelia Bake, PT, DPT 09/03/2014 Pager: [phone]

## 2014-09-03 NOTE — Clinical Social Work Placement (Signed)
   CLINICAL SOCIAL WORK PLACEMENT  NOTE  Date:  09/03/2014  Patient Details  Name: John Potter MRN: [id number] Date of Birth: 1934-11-24  Clinical Social Work is seeking post-discharge placement for this patient at the [place] level of care (*CSW will initial, date and re-position this form in  chart as items are completed):  Yes   Patient/family provided with [place] Work Department's list of facilities offering this level of care within the geographic area requested by the patient (or if unable, by the patient's family).  Yes   Patient/family informed of their freedom to choose among providers that offer the needed level of care, that participate in Medicare, Medicaid or managed care program needed by the patient, have an available bed and are willing to accept the patient.  Yes   Patient/family informed of [facility]'s ownership interest in [facility] and [facility], as well as of the fact that they are under no obligation to receive care at these facilities.  PASRR submitted to EDS on 09/03/14     PASRR number received on 09/03/14     Existing PASRR number confirmed on       FL2 transmitted to all facilities in geographic area requested by pt/family on 09/03/14     FL2 transmitted to all facilities within larger geographic area on       Patient informed that his/her managed care company has contracts with or will negotiate with certain facilities, including the following:        Yes   Patient/family informed of bed offers received.  Patient chooses bed at       Physician recommends and patient chooses bed at      Patient to be transferred to   on  .  Patient to be transferred to facility by       Patient family notified on   of transfer.  Name of family member notified:        PHYSICIAN       Additional Comment:    _______________________________________________ Standley Brooking, LCSW 09/03/2014, 4:46 PM

## 2014-09-03 NOTE — Clinical Social Work Note (Signed)
Clinical Social Work Assessment  Patient Details  Name: John Potter MRN: [id number] Date of Birth: 13-Jul-1934  Date of referral:  09/03/14               Reason for consult:  Facility Placement                Permission sought to share information with:  Chartered certified accountant granted to share information::  Yes, Verbal Permission Granted  Name::        Agency::     Relationship::     Contact Information:     Housing/Transportation Living arrangements for the past 2 months:  Single Family Home Source of Information:  Patient, Adult Children Patient Interpreter Needed:  None Criminal Activity/Legal Involvement Pertinent to Current Situation/Hospitalization:  No - Comment as needed Significant Relationships:  Adult Children Lives with:  Adult Children Do you feel safe going back to the place where you live?  No Need for family participation in patient care:  Yes (Comment)  Care giving concerns:  CSW reviewed PT evaluation recommending SNF at discharge.    Social Worker assessment / plan:  CSW spoke with patient & daughter, [name] at bedside re: SNF placement.   Employment status:  Retired Nurse, adult PT Recommendations:  [place] / Referral to community resources:  [place]  Patient/Family's Response to care:  Patient & daughter agreeable with plan for SNF - patient has never been to a SNF in the past and daughter would like to tour facilities before making a decision.   Patient/Family's Understanding of and Emotional Response to Diagnosis, Current Treatment, and Prognosis:  Patient is agreeable to SNF before returning home - states that he feels he would benefit from additional PT.   Emotional Assessment Appearance:  Appears stated age Attitude/Demeanor/Rapport:    Affect (typically observed):  Calm, Pleasant, Quiet Orientation:  Oriented to Self, Oriented to Place, Oriented to  Time,  Oriented to Situation Alcohol / Substance use:    Psych involvement (Current and /or in the community):     Discharge Needs  Concerns to be addressed:    Readmission within the last 30 days:    Current discharge risk:    Barriers to Discharge:      Standley Brooking, LCSW 09/03/2014, 4:42 PM

## 2014-09-03 NOTE — Progress Notes (Signed)
SATURATION QUALIFICATIONS: (This note is used to comply with regulatory documentation for home oxygen)  Patient Saturations on Room Air at Rest = 87%  Patient Saturations on Room Air while Ambulating = N/A  Patient Saturations on 3 Liters of oxygen while transferring = 90%  Please briefly explain why patient needs home oxygen: to maintain oxygen saturations above 88% on room air  Carmelia Bake, PT, DPT 09/03/2014 Pager: [phone]

## 2014-09-04 ENCOUNTER — Inpatient Hospital Stay (HOSPITAL_COMMUNITY): Payer: Medicare Other

## 2014-09-04 DIAGNOSIS — J189 Pneumonia, unspecified organism: Secondary | ICD-10-CM

## 2014-09-04 LAB — COMPREHENSIVE METABOLIC PANEL
ALT: 36 U/L (ref 17–63)
ANION GAP: 7 (ref 5–15)
AST: 69 U/L — AB (ref 15–41)
Albumin: 1.8 g/dL — ABNORMAL LOW (ref 3.5–5.0)
Alkaline Phosphatase: 58 U/L (ref 38–126)
BUN: 35 mg/dL — ABNORMAL HIGH (ref 6–20)
CHLORIDE: 105 mmol/L (ref 101–111)
CO2: 26 mmol/L (ref 22–32)
Calcium: 10.1 mg/dL (ref 8.9–10.3)
Creatinine, Ser: 1.34 mg/dL — ABNORMAL HIGH (ref 0.61–1.24)
GFR, EST AFRICAN AMERICAN: 57 mL/min — AB (ref >60)
GFR, EST NON AFRICAN AMERICAN: 49 mL/min — AB (ref >60)
Glucose, Bld: 125 mg/dL — ABNORMAL HIGH (ref 65–99)
POTASSIUM: 4.5 mmol/L (ref 3.5–5.1)
Sodium: 138 mmol/L (ref 135–145)
TOTAL PROTEIN: 5.6 g/dL — AB (ref 6.5–8.1)
Total Bilirubin: 0.5 mg/dL (ref 0.3–1.2)

## 2014-09-04 LAB — CBC WITH DIFFERENTIAL/PLATELET
BASOS ABS: 0.1 10*3/uL (ref 0.0–0.1)
Basophils Relative: 0 % (ref 0–1)
Eosinophils Absolute: 0.1 10*3/uL (ref 0.0–0.7)
Eosinophils Relative: 1 % (ref 0–5)
HCT: 27.3 % — ABNORMAL LOW (ref 39.0–52.0)
HEMOGLOBIN: 8 g/dL — AB (ref 13.0–17.0)
LYMPHS ABS: 1.2 10*3/uL (ref 0.7–4.0)
LYMPHS PCT: 8 % — AB (ref 12–46)
MCH: 25 pg — AB (ref 26.0–34.0)
MCHC: 29.3 g/dL — ABNORMAL LOW (ref 30.0–36.0)
MCV: 85.3 fL (ref 78.0–100.0)
Monocytes Absolute: 1.2 10*3/uL — ABNORMAL HIGH (ref 0.1–1.0)
Monocytes Relative: 8 % (ref 3–12)
NEUTROS ABS: 12.1 10*3/uL — AB (ref 1.7–7.7)
NEUTROS PCT: 83 % — AB (ref 43–77)
Platelets: 246 10*3/uL (ref 150–400)
RBC: 3.2 MIL/uL — AB (ref 4.22–5.81)
RDW: 16.5 % — ABNORMAL HIGH (ref 11.5–15.5)
WBC: 14.6 10*3/uL — AB (ref 4.0–10.5)

## 2014-09-04 LAB — GLUCOSE, CAPILLARY
GLUCOSE-CAPILLARY: 126 mg/dL — AB (ref 65–99)
Glucose-Capillary: 154 mg/dL — ABNORMAL HIGH (ref 65–99)
Glucose-Capillary: 178 mg/dL — ABNORMAL HIGH (ref 65–99)
Glucose-Capillary: 224 mg/dL — ABNORMAL HIGH (ref 65–99)

## 2014-09-04 MED ORDER — FUROSEMIDE 10 MG/ML IJ SOLN
40.0000 mg | Freq: Two times a day (BID) | INTRAMUSCULAR | Status: DC
  Administered 2014-09-04 – 2014-09-07 (×6): 40 mg via INTRAVENOUS
  Filled 2014-09-04 (×6): qty 4

## 2014-09-04 NOTE — Progress Notes (Signed)
[facility] PROGRESS NOTE  John Potter TLX:[id number] DOB: 1934/06/03 DOA: 09/01/2014  PCP: Laurey Morale, MD   80 year old Caucasian male with a past medical history of coronary artery disease, hypertension, type 2 diabetes, recently diagnosed with metastatic lung cancer. He was supposed to start chemotherapy next week. He presented with worsening confusion. He was noted to have hypercalcemia. He was dehydrated with hypotension. He was hospitalized for further management.  Past medical history:  Past Medical History  Diagnosis Date  . CAD (coronary artery disease)     sees Dr. Dani Gobble Croitoru   . Hypertension   . Hyperlipidemia   . Rheumatic fever "I was an infant"  . Hyperglycemia   . BPH (benign prostatic hyperplasia)     sees Dr. Junious Silk   . Heart murmur dx'd 1958  . Type II diabetes mellitus dx'd ~ 04/2014    "mild; advised by MD to lose weight; no RX" (07/30/2014)  . History of gout   . Kidney carcinoma 1999    sees Dr. Junious Silk   . Malnutrition related to chronic disease 08/21/2014    Consultants: None. Oncology aware.  Procedures: None  Antibiotics: Vancomycin and imipenem  Subjective: No confusion answers all orienting question appropriately sats 89% when off O2 tol only 20% diet No diarr No f/chill/cp   Objective: Vital Signs  Filed Vitals:   09/04/14 0234 09/04/14 0454 09/04/14 0455 09/04/14 1007  BP: 123/65 122/55  107/52  Pulse: 76 90  80  Temp: 98.2 F (36.8 C) 98.2 F (36.8 C)  97.9 F (36.6 C)  TempSrc: Oral Oral  Oral  Resp: '18 20  28  '$ Height:      Weight:      SpO2: 98% 90% 93% 94%    Intake/Output Summary (Last 24 hours) at 09/04/14 1218 Last data filed at 09/04/14 1000  Gross per 24 hour  Intake 3058.33 ml  Output   1750 ml  Net 1308.33 ml   Filed Weights   09/01/14 1959  Weight: 94.4 kg (208 lb 1.8 oz)    General appearance: alert, cooperative, distracted and no distress-slightly SOB Resp: clear to auscultation  bilaterally Cardio: regular rate and rhythm, S1, S2 normal, no murmur, click, rub or gallop GI: soft, non-tender; bowel sounds normal; no masses,  no organomegaly Extremities: extremities normal, atraumatic, no cyanosis or edema   Lab Results:  Basic Metabolic Panel:  Recent Labs Lab 09/01/14 1432 09/01/14 2050 09/02/14 0105 09/03/14 0419 09/04/14 0600  NA 142 139 140 141 138  K 5.0 5.1 4.9 4.6 4.5  CL  --  104 107 109 105  CO2 '29 30 28 24 26  '$ GLUCOSE 120 121* 126* 119* 125*  BUN 50.0* 51* 49* 38* 35*  CREATININE 1.6* 1.65* 1.45* 1.40* 1.34*  CALCIUM 14.0 Repeated and Verified* 12.7* 12.1* 11.1* 10.1  MG  --   --   --  1.9  --   PHOS  --   --   --  2.4*  --    Liver Function Tests:  Recent Labs Lab 09/01/14 1432 09/01/14 2050 09/02/14 0105 09/03/14 0419 09/04/14 0600  AST 60* 62* 57* 54* 69*  ALT 61* 55 52 42 36  ALKPHOS 84 73 69 63 58  BILITOT 0.35 0.5 0.6 0.6 0.5  PROT 6.2* 6.2* 6.1* 5.6* 5.6*  ALBUMIN 1.9* 2.2* 2.0* 1.9* 1.8*    Recent Labs Lab 09/01/14 1637  LIPASE 42   CBC:  Recent Labs Lab 09/01/14 1432 09/01/14 2050 09/02/14 0105  09/03/14 0419 09/04/14 0600  WBC 16.0* 15.0* 15.0* 15.1* 14.6*  NEUTROABS 12.9* 12.3* 12.3*  --  12.1*  HGB 9.4* 9.0* 9.0* 8.0* 8.0*  HCT 30.9* 30.6* 30.4* 27.3* 27.3*  MCV 84.4 85.7 86.1 85.8 85.3  PLT 305 284 275 253 246   BNP (last 3 results)  Recent Labs  07/30/14 1204  BNP 124.1*    CBG:  Recent Labs Lab 09/02/14 2051 09/03/14 0729 09/03/14 1147 09/03/14 1651 09/03/14 2154  GLUCAP 136* 111* 167* 161* 152*    Recent Results (from the past 240 hour(s))  Culture, blood (routine x 2) Call MD if unable to obtain prior to antibiotics being given     Status: None (Preliminary result)   Collection Time: 09/01/14  9:55 PM  Result Value Ref Range Status   Specimen Description BLOOD RIGHT HAND  Final   Special Requests BOTTLES DRAWN AEROBIC ONLY 5CC  Final   Culture   Final    NO GROWTH 1  DAY Performed at [facility]    Report Status PENDING  Incomplete  Culture, blood (routine x 2) Call MD if unable to obtain prior to antibiotics being given     Status: None (Preliminary result)   Collection Time: 09/01/14 10:00 PM  Result Value Ref Range Status   Specimen Description BLOOD RIGHT ANTECUBITAL  Final   Special Requests BOTTLES DRAWN AEROBIC ONLY 5CC  Final   Culture   Final    NO GROWTH 1 DAY Performed at [facility]    Report Status PENDING  Incomplete      Studies/Results: Dg Chest 2 View  09/04/2014   CLINICAL DATA:  Shortness of breath.  EXAM: CHEST  2 VIEW  COMPARISON:  09/01/2014  FINDINGS: There is essentially complete opacification of the left hemithorax, a worsening from the prior exam.  Median sternotomy noted. Mild airway thickening is suggested in the right lung. Left cardiac border and left mediastinal border obscured by opacity.  IMPRESSION: 1. Progressive and essentially complete opacification of the left hemithorax.   Electronically Signed   By: Van Clines M.D.   On: 09/04/2014 11:35    Medications:  Scheduled: . aspirin EC  81 mg Oral QPM  . calcitonin  400 Units Subcutaneous Q12H  . enoxaparin (LOVENOX) injection  40 mg Subcutaneous Q24H  . feeding supplement (ENSURE ENLIVE)  237 mL Oral BID BM  . furosemide  40 mg Intravenous BID  . insulin aspart  0-5 Units Subcutaneous QHS  . insulin aspart  0-9 Units Subcutaneous TID WC  . levofloxacin (LEVAQUIN) IV  750 mg Intravenous Q24H  . simvastatin  20 mg Oral QHS  . tamsulosin  0.4 mg Oral Daily   Continuous: . sodium chloride 50 mL/hr at 09/03/14 1710   PRN:  Assessment/Plan:  Active Problems:   CAD (coronary artery disease)   Essential hypertension   CKD (chronic kidney disease) stage 3, GFR 30-59 ml/min   Lung mass   Hypercalcemia   Pneumonia   Sepsis   Malignant neoplasm of lower lobe of left lung   Pressure ulcer   Malnutrition of moderate degree  Acute  hypoxic resp failure Sats drop to 80's on room air CXR 8/18 shows significant fluid with opacification of right hemithorax START iv LASIX 40 IV bid Recheck fluid status and Strict I/o Will probably need Oxygen on d/c to SNF  Acute Encephalopathy Secondary to hypercalcemia.   improving.  No focal deficits. Continue to monitor. Reorient daily.  Hypercalcemia of  malignancy Patient has been given fluids and bisphosphonate. Continue with hydration saline 100-->50 cc/hr-saline locked on 8/`8 Also given calcitonin.  Calcium level appears to be improving.  Give Lasix for hypercalcemia as will cause calicuresis  Recently diagnosed stage IV lung cancer Supposed to start chemotherapy next week. His oncologist is aware of this hospitalization. Dr. Earlie Server wiill see patient in 09/04/14  Sepsis with possible pneumonia Postobstructive pneumonia appreciated on chest x-ray, though this could have been a chronic finding.  His WBC was elevated.  Patient started on broad-spectrum coverage 8/15  De-escalated from Primaxin/Vanc-->Levaquin 8/17  History of chronic kidney disease stage III Creatinine is stable. Continue with hydration. Holding lisinopril  History of coronary artery disease Currently stable.  Diabetes mellitus type 2 Monitor CBGs. Sliding scale coverage. Not eating Liberalize diet  Stage 1 Pressure ulcer Turn q 2  Essential hypertension Patient was hypotensive when he was seen in the emergency department.  Blood pressures have improved.  hold his antihypertensives agents. For now  Normocytic anemia Could be due to chronic disease. Hemoglobin is stable. Continue to monitor.   DVT Prophylaxis: Lovenox    Code Status: DO NOT RESUSCITATE  Family Communication: Discussed with the patient and his daughter  Disposition Plan: Continue current management, SNF when ready    LOS: 3 days   Verneita Griffes, MD [facility] [phone]

## 2014-09-04 NOTE — Progress Notes (Signed)
OT Cancellation Note  Patient Details Name: John Potter MRN: [id number] DOB: [birth year]   Cancelled Treatment:    Noted plan for SNF- will defer OT eval to SNF [name], Thereasa Parkin 09/04/2014, 11:37 AM

## 2014-09-04 NOTE — Evaluation (Signed)
Clinical/Bedside Swallow Evaluation Patient Details  Name: John Potter MRN: [id number] Date of Birth: 1934-08-30  Today's Date: 09/04/2014 Time: SLP Start Time (ACUTE ONLY): 0930 SLP Stop Time (ACUTE ONLY): 1005 SLP Time Calculation (min) (ACUTE ONLY): 35 min  Past Medical History:  Past Medical History  Diagnosis Date  . CAD (coronary artery disease)     sees Dr. Dani Gobble Croitoru   . Hypertension   . Hyperlipidemia   . Rheumatic fever "I was an infant"  . Hyperglycemia   . BPH (benign prostatic hyperplasia)     sees Dr. Junious Silk   . Heart murmur dx'd 1958  . Type II diabetes mellitus dx'd ~ 04/2014    "mild; advised by MD to lose weight; no RX" (07/30/2014)  . History of gout   . Kidney carcinoma 1999    sees Dr. Junious Silk   . Malnutrition related to chronic disease 08/21/2014   Past Surgical History:  Past Surgical History  Procedure Laterality Date  . Coronary artery bypass graft  1991    CABG X 3; per Dr. Redmond Pulling   . Nephrectomy Left 1999     per Dr. Hessie Diener   . Vasectomy    . Tonsillectomy and adenoidectomy    . Colonoscopy  2010    clear, repeat in 10 yrs   . US echocardiography  07/14/2009    mod-severe LVH,EF =>55%,LA mildly dilated,mild mitral & aortic annular ca+, AOV mod. sclerotic,discrete nodular thickening of the non-coronary cusp  . Cataract extraction w/ intraocular lens  implant, bilateral Bilateral 2000's  . Cardiac catheterization  1991  . Video bronchoscopy Bilateral 08/04/2014    Procedure: VIDEO BRONCHOSCOPY WITH FLUORO;  Surgeon: Collene Gobble, MD;  Location: [place];  Service: Cardiopulmonary;  Laterality: Bilateral;   HPI:  pt is a 80 yo male adm to [facility] with confusion, hypercalemia, hypotension.  PMH + for CAD, DM2, smoker stopped 1975, metastatic lung cancer with plan for chemotherapy per daughter [name].  Pt MRI negative.  CXR negative for acute changes.    Assessment / Plan / Recommendation Clinical Impression  Pt presents with  functional oropharyngeal swallow ability - no s/s of aspiration with gingerale, applesauce or bagel.  Pt required SLP encouragement to be slid up in bed with staff assist.  Pt does have recent h/o "regurgitation" of food - most recently this past Monday with Apple Randell Patient after a few bites/sips.  Pt stated he sensed lodging in his esophagus.  This regurgitation happens inconsistently per pt/daughter.  Pt reports decreased appetite, gustarory changes, and difficulty getting food down impacting his intake.  He states he is able to clear pills from his esophagus.  Pt also reports intermittently difficulties with meats that effectively clear with liquids.    Educated pt and daughter to recommendations for aspiration/esophageal precautions given pt respiratory status.  Provided tips to xerostomia, esophageal precautions and airway protections tips while eating with respiratory issues.   If further testing is desired/indicated, recommend consider esophagram.  Made daughter and pt aware to monitor swallow ability closely.  Start meals with liquids to moisten mouth/throat and consume liquids throughout meal.       Aspiration Risk    minimal and chronic   Diet Recommendation Age appropriate regular solids;Thin   Medication Administration: Whole meds with liquid Compensations: Slow rate;Small sips/bites;Follow solids with liquid (start meals with liquids)    Other  Recommendations Oral Care Recommendations: Oral care BID   Follow Up Recommendations    n/a   Frequency  and Duration        Pertinent Vitals/Pain Afebrile, decreased      Swallow Study Prior Functional Status   see hhx    General Date of Onset: 09/04/14 Other Pertinent Information: pt is a 80 yo male adm to [facility] with confusion, hypercalemia, hypotension.  PMH + for CAD, DM2, smoker stopped 1975, metastatic lung cancer with plan for chemotherapy per daughter [name].  Pt MRI negative.  CXR negative for acute changes.  Type of Study: Bedside  swallow evaluation Diet Prior to this Study: Regular;Thin liquids Temperature Spikes Noted: No Respiratory Status: Supplemental O2 delivered via (comment) History of Recent Intubation: No Behavior/Cognition: Alert;Cooperative;Pleasant mood Oral Cavity - Dentition: Adequate natural dentition/normal for age Self-Feeding Abilities: Able to feed self;Needs assist Patient Positioning: Upright in bed Baseline Vocal Quality: Normal Volitional Cough: Strong Volitional Swallow: Able to elicit    Oral/Motor/Sensory Function Overall Oral Motor/Sensory Function: Appears within functional limits for tasks assessed (sluggish palatal elevation)   Ice Chips Ice chips: Not tested   Thin Liquid Thin Liquid: Within functional limits Presentation: Self Fed;Straw    Nectar Thick Nectar Thick Liquid: Not tested   Honey Thick Honey Thick Liquid: Not tested   Puree Puree: Within functional limits Presentation: Self Fed;Spoon   Solid   GO    Solid: Impaired Oral Phase Impairments: Impaired mastication Oral Phase Functional Implications: Other (comment) (slow mastication but effective oral clearance)       Janett Labella [place], [facility] SLP [phone]

## 2014-09-05 ENCOUNTER — Other Ambulatory Visit: Payer: Medicare Other

## 2014-09-05 ENCOUNTER — Telehealth: Payer: Self-pay | Admitting: *Deleted

## 2014-09-05 LAB — CBC WITH DIFFERENTIAL/PLATELET
Basophils Absolute: 0 10*3/uL (ref 0.0–0.1)
Basophils Relative: 0 % (ref 0–1)
EOS ABS: 0.1 10*3/uL (ref 0.0–0.7)
Eosinophils Relative: 0 % (ref 0–5)
HEMATOCRIT: 28.7 % — AB (ref 39.0–52.0)
HEMOGLOBIN: 8.7 g/dL — AB (ref 13.0–17.0)
LYMPHS ABS: 1.1 10*3/uL (ref 0.7–4.0)
LYMPHS PCT: 7 % — AB (ref 12–46)
MCH: 25.7 pg — AB (ref 26.0–34.0)
MCHC: 30.3 g/dL (ref 30.0–36.0)
MCV: 84.7 fL (ref 78.0–100.0)
MONOS PCT: 9 % (ref 3–12)
Monocytes Absolute: 1.4 10*3/uL — ABNORMAL HIGH (ref 0.1–1.0)
NEUTROS PCT: 84 % — AB (ref 43–77)
Neutro Abs: 12.8 10*3/uL — ABNORMAL HIGH (ref 1.7–7.7)
Platelets: 284 10*3/uL (ref 150–400)
RBC: 3.39 MIL/uL — AB (ref 4.22–5.81)
RDW: 16.6 % — ABNORMAL HIGH (ref 11.5–15.5)
WBC: 15.3 10*3/uL — AB (ref 4.0–10.5)

## 2014-09-05 LAB — BASIC METABOLIC PANEL
Anion gap: 9 (ref 5–15)
BUN: 35 mg/dL — ABNORMAL HIGH (ref 6–20)
CHLORIDE: 102 mmol/L (ref 101–111)
CO2: 27 mmol/L (ref 22–32)
CREATININE: 1.38 mg/dL — AB (ref 0.61–1.24)
Calcium: 9.3 mg/dL (ref 8.9–10.3)
GFR calc non Af Amer: 47 mL/min — ABNORMAL LOW (ref >60)
GFR, EST AFRICAN AMERICAN: 55 mL/min — AB (ref >60)
Glucose, Bld: 116 mg/dL — ABNORMAL HIGH (ref 65–99)
POTASSIUM: 4.2 mmol/L (ref 3.5–5.1)
SODIUM: 138 mmol/L (ref 135–145)

## 2014-09-05 LAB — GLUCOSE, CAPILLARY
GLUCOSE-CAPILLARY: 113 mg/dL — AB (ref 65–99)
Glucose-Capillary: 114 mg/dL — ABNORMAL HIGH (ref 65–99)
Glucose-Capillary: 138 mg/dL — ABNORMAL HIGH (ref 65–99)

## 2014-09-05 LAB — PROCALCITONIN: Procalcitonin: 0.16 ng/mL

## 2014-09-05 MED ORDER — LEVOFLOXACIN 750 MG PO TABS
750.0000 mg | ORAL_TABLET | ORAL | Status: DC
  Administered 2014-09-05 – 2014-09-06 (×2): 750 mg via ORAL
  Filled 2014-09-05 (×2): qty 1

## 2014-09-05 NOTE — Telephone Encounter (Signed)
Oncology Nurse Navigator Documentation  Oncology Nurse Navigator Flowsheets 09/05/2014  Navigator Encounter Type Telephone/Daughter John Potter called me and left me a message to call.  I called.  She updated me that her dad was going to be discharged possibly today and then go to rehab facility.  She has questions about setting up for chemo.  I explained next steps.    Patient Visit Type Follow-up  Treatment Phase Pre-Tx  Barriers/Navigation Needs Education  Education Other  Interventions Coordination of Care  Time Spent with Patient 15

## 2014-09-05 NOTE — Progress Notes (Signed)
Pharmacy IV to PO conversion  This patient is receiving levofloxacin by the intravenous route. Based on criteria approved by the Pharmacy and Therapeutics Committee, and the Infectious Disease Division, the antibiotic(s) is/are being converted to equivalent oral dose form(s). These criteria include:   Patient being treated for a respiratory tract infection, urinary tract infection, cellulitis, or Clostridium Difficile Associated Diarrhea  The patient is not neutropenic and does not exhibit a GI malabsorption state  The patient is eating (either orally or per tube) and/or has been taking other orally administered medications for at least 24 hours.  The patient is improving clinically (physician assessment and a 24-hour Tmax of <=100.5 F)  If you have any questions about this conversion, please contact the Pharmacy Department (ext [phone]).  Thank you.  Reuel Boom, PharmD Pager: [phone] 09/05/2014, 12:12 PM

## 2014-09-05 NOTE — Progress Notes (Signed)
[facility] PROGRESS NOTE  JAZIR NEWEY WUX:[id number] DOB: [birth year] DOA: 09/01/2014  PCP: Laurey Morale, MD   80 year old Caucasian male with a past medical history of c oronary artery disease,  Hypertension,  type 2 diabetes,  recently diagnosed with metastatic lung cancer.  He was supposed to start chemotherapy next week.  He presented with worsening confusion.  He was noted to have hypercalcemia.  He was dehydrated with hypotension.  He was hospitalized for further management.  Past medical history:  Past Medical History  Diagnosis Date  . CAD (coronary artery disease)     sees Dr. Dani Gobble Croitoru   . Hypertension   . Hyperlipidemia   . Rheumatic fever "I was an infant"  . Hyperglycemia   . BPH (benign prostatic hyperplasia)     sees Dr. Junious Silk   . Heart murmur dx'd 1958  . Type II diabetes mellitus dx'd ~ 04/2014    "mild; advised by MD to lose weight; no RX" (07/30/2014)  . History of gout   . Kidney carcinoma 1999    sees Dr. Junious Silk   . Malnutrition related to chronic disease 08/21/2014    Consultants: None. Oncology aware.  Procedures: None  Antibiotics: Vancomycin and imipenem  Subjective:    Doing fair. Tolerated diet No diarrhea No chest pain Still has mild SOB No nausea No unilateral weakness  Objective: Vital Signs  Filed Vitals:   09/04/14 1348 09/04/14 1800 09/04/14 2235 09/05/14 0509  BP: 128/48 122/58 122/60 135/58  Pulse: 81 84 86 93  Temp: 98.2 F (36.8 C) 98.1 F (36.7 C) 98.2 F (36.8 C) 98.1 F (36.7 C)  TempSrc: Oral Oral Oral Oral  Resp: 32  22 20  Height:      Weight:      SpO2: 95% 95% 98% 95%    Intake/Output Summary (Last 24 hours) at 09/05/14 1110 Last data filed at 09/05/14 0700  Gross per 24 hour  Intake   1110 ml  Output   1651 ml  Net   -541 ml   Filed Weights   09/01/14 1959  Weight: 94.4 kg (208 lb 1.8 oz)    General appearance: alert, cooperative, still short of breath Resp: Chest is  clear this a.m. however still has some mild crackles Cardio: regular rate and rhythm, S1, S2 normal, no murmur, click, rub or gallop GI: soft, non-tender; bowel sounds normal; no masses,  no organomegaly Extremities: extremities normal, atraumatic, no cyanosis or edema   Lab Results:  Basic Metabolic Panel:  Recent Labs Lab 09/01/14 2050 09/02/14 0105 09/03/14 0419 09/04/14 0600 09/05/14 0945  NA 139 140 141 138 138  K 5.1 4.9 4.6 4.5 4.2  CL 104 107 109 105 102  CO2 '30 28 24 26 27  '$ GLUCOSE 121* 126* 119* 125* 116*  BUN 51* 49* 38* 35* 35*  CREATININE 1.65* 1.45* 1.40* 1.34* 1.38*  CALCIUM 12.7* 12.1* 11.1* 10.1 9.3  MG  --   --  1.9  --   --   PHOS  --   --  2.4*  --   --    Liver Function Tests:  Recent Labs Lab 09/01/14 1432 09/01/14 2050 09/02/14 0105 09/03/14 0419 09/04/14 0600  AST 60* 62* 57* 54* 69*  ALT 61* 55 52 42 36  ALKPHOS 84 73 69 63 58  BILITOT 0.35 0.5 0.6 0.6 0.5  PROT 6.2* 6.2* 6.1* 5.6* 5.6*  ALBUMIN 1.9* 2.2* 2.0* 1.9* 1.8*    Recent Labs Lab 09/01/14  1637  LIPASE 42   CBC:  Recent Labs Lab 09/01/14 1432 09/01/14 2050 09/02/14 0105 09/03/14 0419 09/04/14 0600 09/05/14 0945  WBC 16.0* 15.0* 15.0* 15.1* 14.6* 15.3*  NEUTROABS 12.9* 12.3* 12.3*  --  12.1* 12.8*  HGB 9.4* 9.0* 9.0* 8.0* 8.0* 8.7*  HCT 30.9* 30.6* 30.4* 27.3* 27.3* 28.7*  MCV 84.4 85.7 86.1 85.8 85.3 84.7  PLT 305 284 275 253 246 284   BNP (last 3 results)  Recent Labs  07/30/14 1204  BNP 124.1*    CBG:  Recent Labs Lab 09/04/14 0736 09/04/14 1153 09/04/14 1702 09/04/14 2252 09/05/14 0759  GLUCAP 126* 154* 178* 224* 113*    Recent Results (from the past 240 hour(s))  Culture, blood (routine x 2) Call MD if unable to obtain prior to antibiotics being given     Status: None (Preliminary result)   Collection Time: 09/01/14  9:55 PM  Result Value Ref Range Status   Specimen Description BLOOD RIGHT HAND  Final   Special Requests BOTTLES DRAWN  AEROBIC ONLY 5CC  Final   Culture   Final    NO GROWTH 2 DAYS Performed at [facility]    Report Status PENDING  Incomplete  Culture, blood (routine x 2) Call MD if unable to obtain prior to antibiotics being given     Status: None (Preliminary result)   Collection Time: 09/01/14 10:00 PM  Result Value Ref Range Status   Specimen Description BLOOD RIGHT ANTECUBITAL  Final   Special Requests BOTTLES DRAWN AEROBIC ONLY 5CC  Final   Culture   Final    NO GROWTH 2 DAYS Performed at [facility]    Report Status PENDING  Incomplete      Studies/Results: Dg Chest 2 View  09/04/2014   CLINICAL DATA:  Shortness of breath.  EXAM: CHEST  2 VIEW  COMPARISON:  09/01/2014  FINDINGS: There is essentially complete opacification of the left hemithorax, a worsening from the prior exam.  Median sternotomy noted. Mild airway thickening is suggested in the right lung. Left cardiac border and left mediastinal border obscured by opacity.  IMPRESSION: 1. Progressive and essentially complete opacification of the left hemithorax.   Electronically Signed   By: Van Clines M.D.   On: 09/04/2014 11:35    Medications:  Scheduled: . aspirin EC  81 mg Oral QPM  . calcitonin  400 Units Subcutaneous Q12H  . enoxaparin (LOVENOX) injection  40 mg Subcutaneous Q24H  . feeding supplement (ENSURE ENLIVE)  237 mL Oral BID BM  . furosemide  40 mg Intravenous BID  . insulin aspart  0-5 Units Subcutaneous QHS  . insulin aspart  0-9 Units Subcutaneous TID WC  . levofloxacin (LEVAQUIN) IV  750 mg Intravenous Q24H  . simvastatin  20 mg Oral QHS  . tamsulosin  0.4 mg Oral Daily   Continuous:   PRN:  Assessment/Plan:  Active Problems:   CAD (coronary artery disease)   Essential hypertension   CKD (chronic kidney disease) stage 3, GFR 30-59 ml/min   Lung mass   Hypercalcemia   Pneumonia   Sepsis   Malignant neoplasm of lower lobe of left lung   Pressure ulcer   Malnutrition of moderate  degree  Acute hypoxic resp failure Sats drop to 80's on room air CXR 8/18 shows significant fluid with opacification of entire L hemithorax START iv LASIX 40 IV bid -O2 sats on 8/19 still in the 88th percentile so patient will need to hold off on  discharge today -Ins and outs are minus 2100 on 8/18 but still is not positive for 0.6 L Will probably need Oxygen on d/c to SNF  Acute Encephalopathy Secondary to hypercalcemia.   improving.  No focal deficits. Continue to monitor. Reorient daily.  Hypercalcemia of malignancy Patient has been given fluids and bisphosphonate. Continue with hydration saline 100-->50 cc/hr-saline locked on 8/`8 Also given calcitonin.  Calcium level now 9.3  Dual indication for Lasix as  will cause calicuresis  Recently diagnosed stage IV lung cancer Supposed to start chemotherapy next week. His oncologist is aware of this hospitalization. Dr. Earlie Server visited patient 8/19 a.m.  Sepsis with postobstructive pneumonia secondary to large left lung mass Postobstructive pneumonia appreciated on chest x-ray, though this could have been a chronic finding.  His WBC was elevated.  Patient started on broad-spectrum coverage 8/15  De-escalated from Primaxin/Vanc-->Levaquin 8/17. Stop date for antibiotics would be within 7 days  History of chronic kidney disease stage III Creatinine is stable. Continue with hydration. Holding lisinopril  History of coronary artery disease Currently stable.  Diabetes mellitus type 2 Monitor CBGs. Sliding scale coverage. Not eating Liberalize diet  Stage 1 Pressure ulcer Turn q 2  Essential hypertension Patient was hypotensive when he was seen in the emergency department.  Blood pressures have improved.  hold his antihypertensives agents. For now   Normocytic anemia, baseline hemoglobin 8-9 Could be due to chronic disease. Hemoglobin is stable. Continue to monitor.   DVT Prophylaxis: Lovenox    Code Status: DO NOT  RESUSCITATE  Family Communication: No family present at the bedside Disposition Plan: Continue current management, SNF when ready    LOS: 4 days   Verneita Griffes, MD [facility] [phone]

## 2014-09-05 NOTE — Progress Notes (Signed)
Patient to discharge to [facility] when stable. Narda Rutherford at [facility] aware. If ready over the weekend, please contact weekend CSW, Rollene Fare (ph#: [phone]) to facilitate discharge.      Raynaldo Opitz, [facility] Clinical Social Worker cell #: [phone]

## 2014-09-06 DIAGNOSIS — R918 Other nonspecific abnormal finding of lung field: Secondary | ICD-10-CM

## 2014-09-06 LAB — GLUCOSE, CAPILLARY
GLUCOSE-CAPILLARY: 132 mg/dL — AB (ref 65–99)
GLUCOSE-CAPILLARY: 130 mg/dL — AB (ref 65–99)
Glucose-Capillary: 158 mg/dL — ABNORMAL HIGH (ref 65–99)
Glucose-Capillary: 125 mg/dL — ABNORMAL HIGH (ref 65–99)
Glucose-Capillary: 148 mg/dL — ABNORMAL HIGH (ref 65–99)

## 2014-09-06 LAB — BASIC METABOLIC PANEL
ANION GAP: 8 (ref 5–15)
BUN: 39 mg/dL — AB (ref 6–20)
CALCIUM: 9.1 mg/dL (ref 8.9–10.3)
CO2: 28 mmol/L (ref 22–32)
Chloride: 98 mmol/L — ABNORMAL LOW (ref 101–111)
Creatinine, Ser: 1.44 mg/dL — ABNORMAL HIGH (ref 0.61–1.24)
GFR calc Af Amer: 52 mL/min — ABNORMAL LOW (ref >60)
GFR, EST NON AFRICAN AMERICAN: 45 mL/min — AB (ref >60)
GLUCOSE: 156 mg/dL — AB (ref 65–99)
Potassium: 3.9 mmol/L (ref 3.5–5.1)
Sodium: 134 mmol/L — ABNORMAL LOW (ref 135–145)

## 2014-09-06 NOTE — Progress Notes (Signed)
[facility] PROGRESS NOTE  John Potter [id number]:[id number] DOB: 05-Jun-1934 DOA: 09/01/2014  PCP: Laurey Morale, MD   80 year old Caucasian male with a past medical history of c oronary artery disease,  Hypertension,  type 2 diabetes,  recently diagnosed with metastatic lung cancer.  He was supposed to start chemotherapy next week.  He presented with worsening confusion.  He was noted to have hypercalcemia.  He was dehydrated with hypotension.  He was hospitalized for further management.  Past medical history:  Past Medical History  Diagnosis Date  . CAD (coronary artery disease)     sees Dr. Dani Gobble Croitoru   . Hypertension   . Hyperlipidemia   . Rheumatic fever "I was an infant"  . Hyperglycemia   . BPH (benign prostatic hyperplasia)     sees Dr. Junious Silk   . Heart murmur dx'd 1958  . Type II diabetes mellitus dx'd ~ 04/2014    "mild; advised by MD to lose weight; no RX" (07/30/2014)  . History of gout   . Kidney carcinoma 1999    sees Dr. Junious Silk   . Malnutrition related to chronic disease 08/21/2014    Consultants: None. Oncology aware.  Procedures: None  Antibiotics: Vancomycin and imipenem  Subjective:    Doing fair. Tolerated diet No diarrhea No chest pain Still has mild SOB No nausea No unilateral weakness  Objective: Vital Signs  Filed Vitals:   09/04/14 2235 09/05/14 0509 09/05/14 1421 09/06/14 0452  BP: 122/60 135/58 129/60 117/52  Pulse: 86 93 96 95  Temp: 98.2 F (36.8 C) 98.1 F (36.7 C) 98.1 F (36.7 C) 98.3 F (36.8 C)  TempSrc: Oral Oral Oral Oral  Resp: '22 20 22 22  '$ Height:      Weight:      SpO2: 98% 95% 93% 91%    Intake/Output Summary (Last 24 hours) at 09/06/14 0934 Last data filed at 09/06/14 0159  Gross per 24 hour  Intake      0 ml  Output   2075 ml  Net  -2075 ml   Filed Weights   09/01/14 1959  Weight: 94.4 kg (208 lb 1.8 oz)    General appearance: alert, cooperative, still short of breath Resp: Chest  is clear this a.m. however still has some mild crackles Cardio: regular rate and rhythm, S1, S2 normal, no murmur, click, rub or gallop GI: soft, non-tender; bowel sounds normal; no masses,  no organomegaly Extremities: no LE edema  Lab Results:  Basic Metabolic Panel:  Recent Labs Lab 09/01/14 2050 09/02/14 0105 09/03/14 0419 09/04/14 0600 09/05/14 0945  NA 139 140 141 138 138  K 5.1 4.9 4.6 4.5 4.2  CL 104 107 109 105 102  CO2 '30 28 24 26 27  '$ GLUCOSE 121* 126* 119* 125* 116*  BUN 51* 49* 38* 35* 35*  CREATININE 1.65* 1.45* 1.40* 1.34* 1.38*  CALCIUM 12.7* 12.1* 11.1* 10.1 9.3  MG  --   --  1.9  --   --   PHOS  --   --  2.4*  --   --    Liver Function Tests:  Recent Labs Lab 09/01/14 1432 09/01/14 2050 09/02/14 0105 09/03/14 0419 09/04/14 0600  AST 60* 62* 57* 54* 69*  ALT 61* 55 52 42 36  ALKPHOS 84 73 69 63 58  BILITOT 0.35 0.5 0.6 0.6 0.5  PROT 6.2* 6.2* 6.1* 5.6* 5.6*  ALBUMIN 1.9* 2.2* 2.0* 1.9* 1.8*    Recent Labs Lab 09/01/14 1637  LIPASE  42   CBC:  Recent Labs Lab 09/01/14 1432 09/01/14 2050 09/02/14 0105 09/03/14 0419 09/04/14 0600 09/05/14 0945  WBC 16.0* 15.0* 15.0* 15.1* 14.6* 15.3*  NEUTROABS 12.9* 12.3* 12.3*  --  12.1* 12.8*  HGB 9.4* 9.0* 9.0* 8.0* 8.0* 8.7*  HCT 30.9* 30.6* 30.4* 27.3* 27.3* 28.7*  MCV 84.4 85.7 86.1 85.8 85.3 84.7  PLT 305 284 275 253 246 284   BNP (last 3 results)  Recent Labs  07/30/14 1204  BNP 124.1*    CBG:  Recent Labs Lab 09/05/14 0759 09/05/14 1158 09/05/14 1656 09/05/14 2203 09/06/14 0727  GLUCAP 113* 114* 138* 132* 130*    Recent Results (from the past 240 hour(s))  Culture, blood (routine x 2) Call MD if unable to obtain prior to antibiotics being given     Status: None (Preliminary result)   Collection Time: 09/01/14  9:55 PM  Result Value Ref Range Status   Specimen Description BLOOD RIGHT HAND  Final   Special Requests BOTTLES DRAWN AEROBIC ONLY 5CC  Final   Culture   Final     NO GROWTH 3 DAYS Performed at [facility]    Report Status PENDING  Incomplete  Culture, blood (routine x 2) Call MD if unable to obtain prior to antibiotics being given     Status: None (Preliminary result)   Collection Time: 09/01/14 10:00 PM  Result Value Ref Range Status   Specimen Description BLOOD RIGHT ANTECUBITAL  Final   Special Requests BOTTLES DRAWN AEROBIC ONLY 5CC  Final   Culture   Final    NO GROWTH 3 DAYS Performed at [facility]    Report Status PENDING  Incomplete      Studies/Results: Dg Chest 2 View  09/04/2014   CLINICAL DATA:  Shortness of breath.  EXAM: CHEST  2 VIEW  COMPARISON:  09/01/2014  FINDINGS: There is essentially complete opacification of the left hemithorax, a worsening from the prior exam.  Median sternotomy noted. Mild airway thickening is suggested in the right lung. Left cardiac border and left mediastinal border obscured by opacity.  IMPRESSION: 1. Progressive and essentially complete opacification of the left hemithorax.   Electronically Signed   By: Van Clines M.D.   On: 09/04/2014 11:35    Medications:  Scheduled: . aspirin EC  81 mg Oral QPM  . calcitonin  400 Units Subcutaneous Q12H  . enoxaparin (LOVENOX) injection  40 mg Subcutaneous Q24H  . feeding supplement (ENSURE ENLIVE)  237 mL Oral BID BM  . furosemide  40 mg Intravenous BID  . insulin aspart  0-5 Units Subcutaneous QHS  . insulin aspart  0-9 Units Subcutaneous TID WC  . levofloxacin  750 mg Oral Q24H  . simvastatin  20 mg Oral QHS  . tamsulosin  0.4 mg Oral Daily   Continuous:   PRN:  Assessment/Plan:  Active Problems:   CAD (coronary artery disease)   Essential hypertension   CKD (chronic kidney disease) stage 3, GFR 30-59 ml/min   Lung mass   Hypercalcemia   Pneumonia   Sepsis   Malignant neoplasm of lower lobe of left lung   Pressure ulcer   Malnutrition of moderate degree  Acute hypoxic resp failure Sats drop to 80's on room  air CXR 8/18 shows significant fluid with opacification of entire L hemithorax Continue Lasix IV BID - Fluid status still slight + 3.1 liters - need Oxygen on d/c to SNF  Acute Encephalopathy Secondary to hypercalcemia.  improved and  at baseline No focal deficits. .  Hypercalcemia of malignancy Patient has been given fluids and bisphosphonate. Continue with hydration saline 100-->50 cc/hr-saline locked on 8/18 Also given calcitonin.  Calcium level pending this am Dual indication for Lasix as will cause calicuresis  Recently diagnosed stage IV lung cancer Supposed to start chemotherapy next week. His oncologist is aware of this hospitalization. Dr. Earlie Server visited patient 8/19 a.m. OP goals of care discussion as well as discussion about Chemo Note patient has 1 solitary kidney after cancer resected in 1999  Sepsis with postobstructive pneumonia secondary to large left lung mass Postobstructive pneumonia appreciated on chest x-ray, though this could have been a chronic finding.  His WBC was elevated.  Patient started on broad-spectrum coverage 8/15  De-escalated from Primaxin/Vanc-->Levaquin 8/17. Stop date for antibiotics would be within 7 days  History of chronic kidney disease stage iv [solitary kidney] Creatinine is stable. Continue with hydration. Holding lisinopril  History of coronary artery disease Currently stable.  Diabetes mellitus type 2 Monitor CBGs. Ranges 130's Not eating much Liberalize diet  Stage 1 Pressure ulcer Turn q 2  Essential hypertension Patient was hypotensive when he was seen in the emergency department.  Blood pressures have improved.  hold his antihypertensives agents moving forward Avoid Diuretics/ACE as solitary kidney  Normocytic anemia, baseline hemoglobin 8-9 Could be due to chronic disease. Hemoglobin is stable. Continue to monitor.   DVT Prophylaxis: Lovenox    Code Status: DO NOT RESUSCITATE  Family Communication: d/w  daughter bedside 8/20 Disposition Plan:  SNF when ready~1-2 days    LOS: 5 days   Verneita Griffes, MD [facility] [phone]

## 2014-09-07 ENCOUNTER — Inpatient Hospital Stay (HOSPITAL_COMMUNITY): Payer: Medicare Other

## 2014-09-07 DIAGNOSIS — R0601 Orthopnea: Secondary | ICD-10-CM | POA: Diagnosis not present

## 2014-09-07 DIAGNOSIS — R1311 Dysphagia, oral phase: Secondary | ICD-10-CM | POA: Diagnosis not present

## 2014-09-07 DIAGNOSIS — C3490 Malignant neoplasm of unspecified part of unspecified bronchus or lung: Secondary | ICD-10-CM | POA: Diagnosis not present

## 2014-09-07 DIAGNOSIS — C3432 Malignant neoplasm of lower lobe, left bronchus or lung: Secondary | ICD-10-CM | POA: Diagnosis not present

## 2014-09-07 DIAGNOSIS — N183 Chronic kidney disease, stage 3 (moderate): Secondary | ICD-10-CM | POA: Diagnosis not present

## 2014-09-07 DIAGNOSIS — N184 Chronic kidney disease, stage 4 (severe): Secondary | ICD-10-CM | POA: Diagnosis not present

## 2014-09-07 DIAGNOSIS — E43 Unspecified severe protein-calorie malnutrition: Secondary | ICD-10-CM | POA: Diagnosis not present

## 2014-09-07 DIAGNOSIS — J189 Pneumonia, unspecified organism: Secondary | ICD-10-CM | POA: Diagnosis not present

## 2014-09-07 DIAGNOSIS — E440 Moderate protein-calorie malnutrition: Secondary | ICD-10-CM | POA: Diagnosis not present

## 2014-09-07 DIAGNOSIS — Z8553 Personal history of malignant neoplasm of renal pelvis: Secondary | ICD-10-CM | POA: Diagnosis not present

## 2014-09-07 DIAGNOSIS — R278 Other lack of coordination: Secondary | ICD-10-CM | POA: Diagnosis not present

## 2014-09-07 DIAGNOSIS — J9601 Acute respiratory failure with hypoxia: Secondary | ICD-10-CM | POA: Diagnosis not present

## 2014-09-07 DIAGNOSIS — M6281 Muscle weakness (generalized): Secondary | ICD-10-CM | POA: Diagnosis not present

## 2014-09-07 DIAGNOSIS — R0602 Shortness of breath: Secondary | ICD-10-CM | POA: Diagnosis not present

## 2014-09-07 DIAGNOSIS — F419 Anxiety disorder, unspecified: Secondary | ICD-10-CM | POA: Diagnosis not present

## 2014-09-07 DIAGNOSIS — R918 Other nonspecific abnormal finding of lung field: Secondary | ICD-10-CM | POA: Diagnosis not present

## 2014-09-07 DIAGNOSIS — D649 Anemia, unspecified: Secondary | ICD-10-CM | POA: Diagnosis not present

## 2014-09-07 DIAGNOSIS — N289 Disorder of kidney and ureter, unspecified: Secondary | ICD-10-CM | POA: Diagnosis not present

## 2014-09-07 DIAGNOSIS — E860 Dehydration: Secondary | ICD-10-CM | POA: Diagnosis not present

## 2014-09-07 LAB — CULTURE, BLOOD (ROUTINE X 2)
CULTURE: NO GROWTH
Culture: NO GROWTH

## 2014-09-07 LAB — GLUCOSE, CAPILLARY
Glucose-Capillary: 133 mg/dL — ABNORMAL HIGH (ref 65–99)
Glucose-Capillary: 166 mg/dL — ABNORMAL HIGH (ref 65–99)

## 2014-09-07 MED ORDER — FUROSEMIDE 40 MG PO TABS: 80.0000 mg | ORAL_TABLET | Freq: Two times a day (BID) | ORAL | Status: AC

## 2014-09-07 MED ORDER — LEVOFLOXACIN 750 MG PO TABS: 750.0000 mg | ORAL_TABLET | ORAL | Status: AC

## 2014-09-07 NOTE — Discharge Summary (Addendum)
Physician Discharge Summary  John Potter KKX:[id number] DOB: 05-19-1934 DOA: 09/01/2014  PCP: Laurey Morale, MD  Admit date: 09/01/2014 Discharge date: 09/07/2014  Time spent: 45 minutes  Recommendations for Outpatient Follow-up:  1. Will need Lasix 80 mg x 2 per day until 8/24 and then re-evaluate need for dosing of this 2. Needs bmet an check on calcium in  About 4 days   3. Complete course of Levaquin for postobstructive/healthcare associated pneumonia on 09/10/14 4. Patient will be discharged to [facility] facility for further care 5. Calcitonin was given during this hospital admission but as calcium has resolved we have discontinued this 6. Please do not give any further calcium or vitamin D supplementation 7. I would recommend goals of care as an outpatient with the patient's oncologist moving forward as he does have a large cancer and his ECOG status at baseline is poor and he has a solitary kidney which may preclude there is chemotherapeutic agents as we are actively diuresing him  Discharge Diagnoses:  Active Problems:   CAD (coronary artery disease)   Essential hypertension   CKD (chronic kidney disease) stage 3, GFR 30-59 ml/min   Lung mass   Hypercalcemia   Pneumonia   Sepsis   Malignant neoplasm of lower lobe of left lung   Pressure ulcer   Malnutrition of moderate degree   Discharge Condition: Guarded  Diet recommendation: Regular diet on discharge  Filed Weights   09/01/14 1959  Weight: 94.4 kg (208 lb 1.8 oz)    History of present illness:  80 year old Caucasian male with a past medical history of c oronary artery disease,  Hypertension, type 2 diabetes,  recently diagnosed with metastatic lung cancer. He was supposed to start chemotherapy next week.  He presented with worsening confusion.  He was noted to have hypercalcemia.  He was dehydrated with hypotension.  He was hospitalized for further management.    Hospital Course:  Active  Problems:  CAD (coronary artery disease)  Essential hypertension  CKD (chronic kidney disease) stage 3, GFR 30-59 ml/min  Lung mass  Hypercalcemia  Pneumonia  Sepsis  Malignant neoplasm of lower lobe of left lung  Pressure ulcer  Malnutrition of moderate degree  Acute hypoxic resp failure Sats did drop to 80's on room air CXR 8/18 shows significant fluid with opacification of entire L hemithorax Was placed on Lasix IV BID 40 mg and was transitioned to by mouth 80 mg Lasix twice a day on day of discharge 8/21 - Fluid status still slight + 1.6 L -Continue diuresis as an outpatient - need Oxygen on d/c to SNF  Acute Encephalopathy Secondary to hypercalcemia.  improved and at baseline No focal deficits.   Hypercalcemia of malignancy Patient has been given fluids and bisphosphonate. Continue with hydration saline 100-->50 cc/hr-saline locked on 8/18 Also given calcitonin.  Calcium level has dropped to the 9 range after pamidronate and calcitonin Dual indication for Lasix as will cause calicuresis  Recently diagnosed stage IV lung cancer Supposed to start chemotherapy next week. His oncologist is aware of this hospitalization. Dr. Earlie Server visited patient 8/19 a.m. OP goals of care discussion as well as discussion about Chemo Note patient has 1 solitary kidney after cancer resected in 1999  Sepsis with postobstructive pneumonia secondary to large left lung mass Postobstructive pneumonia appreciated on chest x-ray, though this could have been a chronic finding.  His WBC was elevated.  Patient started on broad-spectrum coverage 8/15 De-escalated from Primaxin/Vanc-->Levaquin 8/17. Stop date for antibiotics would  be within 7 days  History of chronic kidney disease stage iv [solitary kidney] Creatinine is stable. Continue with hydration. Holding lisinopril  History of coronary artery disease Currently stable.  Diabetes mellitus type 2 Monitor CBGs. Ranges  130-140 range Not eating much Liberalize diet to regular diet on discharge  Stage 1 Pressure ulcer Turn q 2  Essential hypertension Patient was hypotensive when he was seen in the emergency department.  Blood pressures have improved.  hold his antihypertensives agents moving forward Avoid Diuretics/ACE as solitary kidney  Normocytic anemia, baseline hemoglobin 8-9 Could be due to chronic disease. Hemoglobin is stable. Continue to monitor.  Sever p-em Appreciate nutritionist input   Consultations:  none  Discharge Exam: Filed Vitals:   09/07/14 0548  BP: 114/48  Pulse: 96  Temp: 98.1 F (36.7 C)  Resp: 22    General: EOMI NCAT Cardiovascular: S1-S2 no murmur rub or gallop Respiratory: Clinically clear than before  Discharge Instructions   Discharge Instructions    Diet - low sodium heart healthy    Complete by:  As directed      Discharge instructions    Complete by:  As directed      Increase activity slowly    Complete by:  As directed           Current Discharge Medication List    START taking these medications   Details  levofloxacin (LEVAQUIN) 750 MG tablet Take 1 tablet (750 mg total) by mouth daily. Qty: 5 tablet, Refills: 0      CONTINUE these medications which have CHANGED   Details  furosemide (LASIX) 40 MG tablet Take 2 tablets (80 mg total) by mouth 2 (two) times daily. Qty: 90 tablet, Refills: 3   Associated Diagnoses: SOB (shortness of breath); LBBB (left bundle branch block); Coronary artery disease involving native coronary artery of native heart without angina pectoris      CONTINUE these medications which have NOT CHANGED   Details  aspirin EC 81 MG tablet Take 81 mg by mouth every evening.    ferrous sulfate 325 (65 FE) MG tablet TAKE 1 TABLET BY MOUTH ONCE DAILY WITH BREAKFAST Qty: 30 tablet, Refills: 11    fish oil-omega-3 fatty acids 1000 MG capsule Take 2 g by mouth daily.      lisinopril (PRINIVIL,ZESTRIL) 5 MG tablet  Take 1 tablet (5 mg total) by mouth daily. Qty: 30 tablet, Refills: 5    metoprolol tartrate (LOPRESSOR) 25 MG tablet Take 25 mg by mouth 2 (two) times daily.      Multiple Vitamin (MULTIVITAMIN) tablet Take 1 tablet by mouth daily.      Multiple Vitamins-Minerals (PRESERVISION/LUTEIN) CAPS Take 2 capsules by mouth daily.     simvastatin (ZOCOR) 20 MG tablet Take 20 mg by mouth at bedtime.      Tamsulosin HCl (FLOMAX) 0.4 MG CAPS Take 0.4 mg by mouth daily.    vitamin C (ASCORBIC ACID) 500 MG tablet Take 1,000 mg by mouth daily.     hydrocortisone 2.5 % cream Apply topically 2 (two) times daily. Qty: 30 g, Refills: 0    prochlorperazine (COMPAZINE) 10 MG tablet Take 1 tablet (10 mg total) by mouth every 6 (six) hours as needed for nausea or vomiting. Qty: 30 tablet, Refills: 0      STOP taking these medications     Calcium Carb-Cholecalciferol (CALCIUM 600 + D) 600-200 MG-UNIT TABS      methylPREDNISolone (MEDROL DOSEPAK) 4 MG TBPK tablet  Allergies  Allergen Reactions  . Amoxicillin     rash      The results of significant diagnostics from this hospitalization (including imaging, microbiology, ancillary and laboratory) are listed below for reference.    Significant Diagnostic Studies: Dg Chest 1 View  08/19/2014   CLINICAL DATA:  Status post biopsy left lower lobe mass  EXAM: CHEST  1 VIEW  COMPARISON:  Chest radiograph August 04, 2014; chest CT July 31, 2014  FINDINGS: No pneumothorax. There is mass with consolidation throughout the left lower lobe region. There is atelectatic change throughout the left upper lobe. The right lung is clear. Heart size is upper normal with pulmonary vascular within normal limits. Patient is status post internal mammary bypass grafting. No adenopathy appreciable.  IMPRESSION: No pneumothorax. Mass with consolidation on the left, essentially stable. Right lung clear. No change in cardiac silhouette.   Electronically Signed   By: Lowella Grip III M.D.   On: 08/19/2014 13:50   Dg Chest 2 View  09/04/2014   CLINICAL DATA:  Shortness of breath.  EXAM: CHEST  2 VIEW  COMPARISON:  09/01/2014  FINDINGS: There is essentially complete opacification of the left hemithorax, a worsening from the prior exam.  Median sternotomy noted. Mild airway thickening is suggested in the right lung. Left cardiac border and left mediastinal border obscured by opacity.  IMPRESSION: 1. Progressive and essentially complete opacification of the left hemithorax.   Electronically Signed   By: Van Clines M.D.   On: 09/04/2014 11:35   Dg Chest 2 View  09/01/2014   CLINICAL DATA:  Altered mental status. SOB. No fever. Pt takes HTN meds. Borderline diabetic. Quit smoking in 1975. Smoked for 20 years prior to quitting  EXAM: CHEST  2 VIEW  COMPARISON:  08/19/2014  FINDINGS: Left lower to mid lung zone opacity reflects a combination of the known left lower lobe central mass and postobstructive atelectasis/consolidation. There is also a loculated pleural fluid on the left as well as hazy parenchymal opacity and irregular interstitial thickening. These findings are similar to the most recent prior exam.  Right lung is clear.  No right pleural effusion.  There are changes from previous cardiac surgery, stable. Cardiac silhouette is partly obscured by the contiguous left lung and pleural opacity. It appears mildly enlarged but stable.  No pneumothorax.  Multiple left upper quadrant vascular clips, stable.  Bony thorax is grossly intact.  IMPRESSION: 1. No significant change from the recent prior study. 2. Persistent findings on the left related to the left lower lobe central mass and postobstructive left lower lobe atelectasis/consolidation as well as loculated areas of left pleural fluid and areas of irregular interstitial thickening.   Electronically Signed   By: Lajean Manes M.D.   On: 09/01/2014 17:52   Mr Jeri Cos VV Contrast  08/27/2014   CLINICAL DATA:   80 year old male with recent diagnosis of stage IV lung cancer. Staging. Confusion, difficulty walking. Subsequent encounter.  EXAM: MRI HEAD WITHOUT AND WITH CONTRAST  TECHNIQUE: Multiplanar, multiecho pulse sequences of the brain and surrounding structures were obtained without and with intravenous contrast.  CONTRAST:  57m MULTIHANCE GADOBENATE DIMEGLUMINE 529 MG/ML IV SOLN  COMPARISON:  PET-CT 08/14/2014  FINDINGS: No midline shift, mass effect, or evidence of intracranial mass lesion. No abnormal enhancement identified. No dural thickening identified.  Visualized bone marrow signal is within normal limits, mild degenerative sclerosis at the tip of the odontoid.  Cerebral volume is within normal limits for age.  No restricted diffusion to suggest acute infarction. No ventriculomegaly, extra-axial collection or acute intracranial hemorrhage. Cervicomedullary junction and pituitary are within normal limits. Major intracranial vascular flow voids are within normal limits.  Mild for age cerebral white matter T2 and FLAIR hyperintensity, including involvement at the posterior left corona radiata which most resembles a chronic lacunar infarct. Deep gray matter nuclei, brainstem and cerebellum are within normal limits for age.  Visible internal auditory structures appear normal. Mastoids and paranasal sinuses are clear. Normal orbits soft tissues aside from postoperative changes to the globes. Negative scalp soft tissues. Negative visualized cervical spinal cord.  IMPRESSION: 1.  No acute or metastatic intracranial abnormality. 2. Mild for age chronic small vessel disease.   Electronically Signed   By: Genevie Ann M.D.   On: 08/27/2014 09:15   Nm Pet Image Initial (pi) Skull Base To Thigh  08/14/2014   CLINICAL DATA:  Initial treatment strategy for lung mass.  EXAM: NUCLEAR MEDICINE PET SKULL BASE TO THIGH  TECHNIQUE: Template mCi Z-61 FDG was injected intravenously. Full-ring PET imaging was performed from the skull  base to thigh after the radiotracer. CT data was obtained and used for attenuation correction and anatomic localization.  FASTING BLOOD GLUCOSE:  Value: 129 mg/dl  COMPARISON:  Chest CT 07/31/2014.  FINDINGS: NECK  No hypermetabolic lymph nodes in the neck.  CHEST  Large infiltrative hypermetabolic (SUVmax = 09.6-04.5) mass centered in the left lower lobe measuring at least 9.8 x 11.8 cm. There is a nodular thickening of the peribronchovascular and subpleural interstitium in the left upper lobe with multifocal hyper metabolism, compatible with lymphangitic spread of tumor. Moderate partially loculated pleural effusion with multifocal pleural hypermetabolism and nodularity, compatible with a malignant pleural effusion. Innumerable enlarged and hypermetabolic left hilar and mediastinal lymph nodes, including the largest lymph node in the subcarinal nodal station which measures up to 18 mm in short axis (SUVmax = 14.0). One of these hypermetabolic lymph nodes (SUVmax = 5.5)is a low right paratracheal lymph node measuring 11 mm (image 75 of series 4). No right hilar lymphadenopathy. In addition, there is lymphadenopathy in the superior mediastinum, with a 14 mm short axis lymph node adjacent to the proximal left subclavian artery (SUVmax = 16.5). 3.2 x 1.3 cm hypermetabolic (SUVmax = 4.0)JWJXBJ in the right lung is favored to represent an infected bronchocele, and is less likely to represent a metastasis or second primary lesion. Small right pleural effusion lying dependently. Heart size is normal. There is no significant pericardial fluid, thickening or pericardial calcification. There is atherosclerosis of the thoracic aorta, the great vessels of the mediastinum and the coronary arteries, including calcified atherosclerotic plaque in the left main, left anterior descending, left circumflex and right coronary arteries. Status post median sternotomy for CABG, including LIMA to the LAD. Severe calcifications of the  aortic valve. 1.9 cm hypermetabolic (SUVmax = 4.7)WGNF thyroid nodule.  ABDOMEN/PELVIS  No abnormal hypermetabolic activity within the liver, pancreas, adrenal glands, or spleen. 11 mm soft tissue attenuation nodule (image 159 of series 4) in the left lower quadrant of the abdomen is hypermetabolic (SUVmax = 7.0), concerning for a metastatic peritoneal implant. Calcified gallstones lying dependently in the gallbladder. No current findings to suggest an acute cholecystitis at this time. Postoperative changes of left-sided radical nephrectomy. Multiple low-attenuation lesions in the region of the right renal pelvis, presumably parapelvic cysts.  SKELETON  Multifocal sites of skeletal hypermetabolism throughout the visualized axial and appendicular skeleton, compatible with widespread skeletal metastasis. The largest  site of hyper metabolism is a very ill-defined lucent lesion in the left side of the T11 vertebral body (SUVmax = 8.8).  IMPRESSION: 1. Findings, as above, compatible with stage IV (T4, N3, M1b) lung cancer. Specifically, there is a large mass with epicenter in the left lower lobe, lymphangitic spread of tumor in the left upper lobe, malignant left pleural effusion, left hilar and bilateral mediastinal lymphadenopathy, as well as metastatic disease to the peritoneal and visualized axial and appendicular skeleton, as detailed above. 2. Hypermetabolic 1.9 cm left thyroid nodule. This could represent a metastasis or primary thyroid neoplasm. 3. 3.2 x 1.3 cm elongated hypermetabolic lesion in the right upper lobe is favored to represent an infected bronchocele, and is less likely to represent a metastatic lesion. 4. Additional incidental findings, as above.   Electronically Signed   By: Vinnie Langton M.D.   On: 08/14/2014 12:39   Ct Biopsy  08/19/2014   CLINICAL DATA:  LARGE HYPERMETABOLIC LEFT LOWER LOBE MASS  EXAM: CT GUIDED CORE BIOPSY OF LEFT LOWER LOBE MASS  ANESTHESIA/SEDATION: 0.5  Mg IV Versed; 25  mcg IV Fentanyl  Total Moderate Sedation Time: 15 minutes.  PROCEDURE: The procedure risks, benefits, and alternatives were explained to the patient. Questions regarding the procedure were encouraged and answered. The patient understands and consents to the procedure.  The LEFT POSTERIOR BACK was prepped with Betadinein a sterile fashion, and a sterile drape was applied covering the operative field. A sterile gown and sterile gloves were used for the procedure. Local anesthesia was provided with 1% Lidocaine.  Previous imaging reviewed. Patient positioned left side down decubitus. Noncontrast localization CT performed. The large left lower lobe mass was localized with CT. Under sterile conditions and local anesthesia, a 17 gauge 6.8 cm access needle was advanced from a posterior intercostal approach into the lesion along the posterior margin. Needle position confirmed with CT. 18 gauge core biopsies obtained. Samples placed in formalin. Needle tract embolized with a biosentry device.  Complications: None immediate  FINDINGS: Imaging confirms needle placement into the large left lower lobe mass from a posterior approach for core biopsy  IMPRESSION: Successful CT-guided left lower lobe mass 18 gauge core biopsy   Electronically Signed   By: Jerilynn Mages.  Shick M.D.   On: 08/19/2014 13:05    Microbiology: Recent Results (from the past 240 hour(s))  Culture, blood (routine x 2) Call MD if unable to obtain prior to antibiotics being given     Status: None (Preliminary result)   Collection Time: 09/01/14  9:55 PM  Result Value Ref Range Status   Specimen Description BLOOD RIGHT HAND  Final   Special Requests BOTTLES DRAWN AEROBIC ONLY 5CC  Final   Culture   Final    NO GROWTH 4 DAYS Performed at [facility]    Report Status PENDING  Incomplete  Culture, blood (routine x 2) Call MD if unable to obtain prior to antibiotics being given     Status: None (Preliminary result)   Collection Time: 09/01/14 10:00 PM   Result Value Ref Range Status   Specimen Description BLOOD RIGHT ANTECUBITAL  Final   Special Requests BOTTLES DRAWN AEROBIC ONLY 5CC  Final   Culture   Final    NO GROWTH 4 DAYS Performed at [facility]    Report Status PENDING  Incomplete     Labs: Basic Metabolic Panel:  Recent Labs Lab 09/02/14 0105 09/03/14 0419 09/04/14 0600 09/05/14 0945 09/06/14 0948  NA 140 141  138 138 134*  K 4.9 4.6 4.5 4.2 3.9  CL 107 109 105 102 98*  CO2 '28 24 26 27 28  '$ GLUCOSE 126* 119* 125* 116* 156*  BUN 49* 38* 35* 35* 39*  CREATININE 1.45* 1.40* 1.34* 1.38* 1.44*  CALCIUM 12.1* 11.1* 10.1 9.3 9.1  MG  --  1.9  --   --   --   PHOS  --  2.4*  --   --   --    Liver Function Tests:  Recent Labs Lab 09/01/14 1432 09/01/14 2050 09/02/14 0105 09/03/14 0419 09/04/14 0600  AST 60* 62* 57* 54* 69*  ALT 61* 55 52 42 36  ALKPHOS 84 73 69 63 58  BILITOT 0.35 0.5 0.6 0.6 0.5  PROT 6.2* 6.2* 6.1* 5.6* 5.6*  ALBUMIN 1.9* 2.2* 2.0* 1.9* 1.8*    Recent Labs Lab 09/01/14 1637  LIPASE 42   No results for input(s): AMMONIA in the last 168 hours. CBC:  Recent Labs Lab 09/01/14 1432 09/01/14 2050 09/02/14 0105 09/03/14 0419 09/04/14 0600 09/05/14 0945  WBC 16.0* 15.0* 15.0* 15.1* 14.6* 15.3*  NEUTROABS 12.9* 12.3* 12.3*  --  12.1* 12.8*  HGB 9.4* 9.0* 9.0* 8.0* 8.0* 8.7*  HCT 30.9* 30.6* 30.4* 27.3* 27.3* 28.7*  MCV 84.4 85.7 86.1 85.8 85.3 84.7  PLT 305 284 275 253 246 284   Cardiac Enzymes: No results for input(s): CKTOTAL, CKMB, CKMBINDEX, TROPONINI in the last 168 hours. BNP: BNP (last 3 results)  Recent Labs  07/30/14 1204  BNP 124.1*    ProBNP (last 3 results) No results for input(s): PROBNP in the last 8760 hours.  CBG:  Recent Labs Lab 09/06/14 0727 09/06/14 1204 09/06/14 1630 09/06/14 2052 09/07/14 0736  GLUCAP 130* 158* 125* 148* 133*       Signed:  Nita Sells  [facility] 09/07/2014, 10:21 AM

## 2014-09-07 NOTE — Progress Notes (Signed)
ANTIBIOTIC CONSULT NOTE - Follow up  Pharmacy Consult for Levaquin Indication: CAP  Allergies  Allergen Reactions  . Amoxicillin     rash    Patient Measurements: Height: '5\' 9"'$  (175.3 cm) Weight: 208 lb 1.8 oz (94.4 kg) IBW/kg (Calculated) : 70.7 Adjusted Body Weight:   Vital Signs: Temp: 98.1 F (36.7 C) (08/21 0548) Temp Source: Oral (08/21 0548) BP: 114/48 mmHg (08/21 0548) Pulse Rate: 96 (08/21 0548) Intake/Output from previous day: 08/20 0701 - 08/21 0700 In: 720 [P.O.:720] Out: 2650 [Urine:2650] Intake/Output from this shift:    Labs:  Recent Labs  09/05/14 0945 09/06/14 0948  WBC 15.3*  --   HGB 8.7*  --   PLT 284  --   CREATININE 1.38* 1.44*   Estimated Creatinine Clearance: 48 mL/min (by C-G formula based on Cr of 1.44). No results for input(s): VANCOTROUGH, VANCOPEAK, VANCORANDOM, GENTTROUGH, GENTPEAK, GENTRANDOM, TOBRATROUGH, TOBRAPEAK, TOBRARND, AMIKACINPEAK, AMIKACINTROU, AMIKACIN in the last 72 hours.   Microbiology: Recent Results (from the past 720 hour(s))  Culture, blood (routine x 2) Call MD if unable to obtain prior to antibiotics being given     Status: None (Preliminary result)   Collection Time: 09/01/14  9:55 PM  Result Value Ref Range Status   Specimen Description BLOOD RIGHT HAND  Final   Special Requests BOTTLES DRAWN AEROBIC ONLY 5CC  Final   Culture   Final    NO GROWTH 4 DAYS Performed at [facility]    Report Status PENDING  Incomplete  Culture, blood (routine x 2) Call MD if unable to obtain prior to antibiotics being given     Status: None (Preliminary result)   Collection Time: 09/01/14 10:00 PM  Result Value Ref Range Status   Specimen Description BLOOD RIGHT ANTECUBITAL  Final   Special Requests BOTTLES DRAWN AEROBIC ONLY 5CC  Final   Culture   Final    NO GROWTH 4 DAYS Performed at [facility]    Report Status PENDING  Incomplete   Assessment: 80 yoM on broad spectrum IV antibiotics for sepsis 2/2  possible PNA now to narrow to levaquin monotherapy.  Noted hx CKD -III. WBCs remain elevated.   8/16 >> Vanc >> 8/17 8/16 >> Primaxin >>  8/17 8/17 >> Levaquin >> (8/24?)  Temp: remains afebrile WBC: moderately elevated, stable Renal: SCr slightly worse, CrCl 48CG, 43 N  8/15: blood: NGTD S. pneumo UAg: neg Legionella UAg: neg  Goal of Therapy:  Appropriate antibiotic dosing for renal function; eradication of infection  Plan:  Continue Levaquin '750mg'$  PO q24h. Today is day #5 of a planned 7.  F/u renal fxn and cultures.   Romeo Rabon, PharmD, pager [phone]. 09/07/2014,9:24 AM.

## 2014-09-07 NOTE — Clinical Social Work Placement (Signed)
   CLINICAL SOCIAL WORK PLACEMENT  NOTE  Date:  09/07/2014  Patient Details  Name: SKANDA WORLDS MRN: [id number] Date of Birth: [birth year]  Clinical Social Work is seeking post-discharge placement for this patient at the [place] level of care (*CSW will initial, date and re-position this form in  chart as items are completed):  Yes   Patient/family provided with [facility] Work Department's list of facilities offering this level of care within the geographic area requested by the patient (or if unable, by the patient's family).  Yes   Patient/family informed of their freedom to choose among providers that offer the needed level of care, that participate in Medicare, Medicaid or managed care program needed by the patient, have an available bed and are willing to accept the patient.  Yes   Patient/family informed of [facility]'s ownership interest in [facility] and [facility], as well as of the fact that they are under no obligation to receive care at these facilities.  PASRR submitted to EDS on 09/03/14     PASRR number received on 09/03/14     Existing PASRR number confirmed on       FL2 transmitted to all facilities in geographic area requested by pt/family on 09/03/14     FL2 transmitted to all facilities within larger geographic area on       Patient informed that his/her managed care company has contracts with or will negotiate with certain facilities, including the following:        Yes   Patient/family informed of bed offers received.  Patient chooses bed at   [facility] recommends and patient chooses bed at     Patient to be transferred to  [facility]  on  .September 07, 2014  Patient to be transferred to facility by  ambulance     Patient family notified on  September 07, 2014 of transfer.  Name of family member notified:     [name] and Karna Christmas both daughters   PHYSICIAN       Additional Comment:     _______________________________________________ Carlean Jews, LCSW 09/07/2014, 3:11 PM

## 2014-09-08 ENCOUNTER — Ambulatory Visit: Payer: Medicare Other | Admitting: Emergency Medicine

## 2014-09-10 ENCOUNTER — Encounter: Payer: Medicare Other | Admitting: Nutrition

## 2014-09-10 ENCOUNTER — Ambulatory Visit: Payer: Medicare Other

## 2014-09-10 ENCOUNTER — Other Ambulatory Visit: Payer: Medicare Other

## 2014-09-11 ENCOUNTER — Telehealth: Payer: Self-pay | Admitting: *Deleted

## 2014-09-11 NOTE — Telephone Encounter (Signed)
Oncology Nurse Navigator Documentation  Oncology Nurse Navigator Flowsheets 09/11/2014  Navigator Encounter Type Telephone/daughter called left me a message regarding next steps.  I called and spoke with [facility] to see how patient is doing.  I updated the nurse on appt on 09/16/14 arrive at 2:30.  She verbalized understanding of appt and will provide transportation.  I called [name] to update her.  She was thankful for the call.  All questions and concerns addressed   Patient Visit Type Follow-up  Treatment Phase Pre-treatment  Barriers/Navigation Needs Coordination of care  Interventions Coordination of Care  Coordination of Care MD Appointments;Other  Time Spent with Patient 30

## 2014-09-12 LAB — AFB CULTURE WITH SMEAR (NOT AT ARMC): ACID FAST SMEAR: NONE SEEN

## 2014-09-16 ENCOUNTER — Other Ambulatory Visit (HOSPITAL_BASED_OUTPATIENT_CLINIC_OR_DEPARTMENT_OTHER): Payer: Medicare Other

## 2014-09-16 ENCOUNTER — Ambulatory Visit (HOSPITAL_BASED_OUTPATIENT_CLINIC_OR_DEPARTMENT_OTHER): Payer: Medicare Other | Admitting: Physician Assistant

## 2014-09-16 ENCOUNTER — Ambulatory Visit (HOSPITAL_COMMUNITY)
Admission: RE | Admit: 2014-09-16 | Discharge: 2014-09-16 | Disposition: A | Discharge status: Home / Self Care | Payer: Medicare Other | Source: Ambulatory Visit | Attending: Physician Assistant | Admitting: Physician Assistant

## 2014-09-16 ENCOUNTER — Encounter: Payer: Self-pay | Admitting: Physician Assistant

## 2014-09-16 ENCOUNTER — Ambulatory Visit (HOSPITAL_BASED_OUTPATIENT_CLINIC_OR_DEPARTMENT_OTHER): Payer: Medicare Other

## 2014-09-16 VITALS — BP 82/39 | HR 79 | Temp 98.0°F | Resp 22 | Ht 69.0 in

## 2014-09-16 DIAGNOSIS — N289 Disorder of kidney and ureter, unspecified: Secondary | ICD-10-CM

## 2014-09-16 DIAGNOSIS — D649 Anemia, unspecified: Secondary | ICD-10-CM

## 2014-09-16 DIAGNOSIS — I959 Hypotension, unspecified: Secondary | ICD-10-CM

## 2014-09-16 DIAGNOSIS — C3432 Malignant neoplasm of lower lobe, left bronchus or lung: Secondary | ICD-10-CM

## 2014-09-16 DIAGNOSIS — R918 Other nonspecific abnormal finding of lung field: Secondary | ICD-10-CM

## 2014-09-16 DIAGNOSIS — E860 Dehydration: Secondary | ICD-10-CM

## 2014-09-16 LAB — COMPREHENSIVE METABOLIC PANEL (CC13)
ALT: 45 U/L (ref 0–55)
AST: 70 U/L — AB (ref 5–34)
Albumin: 1.6 g/dL — ABNORMAL LOW (ref 3.5–5.0)
Alkaline Phosphatase: 52 U/L (ref 40–150)
Anion Gap: 13 mEq/L — ABNORMAL HIGH (ref 3–11)
BILIRUBIN TOTAL: 0.32 mg/dL (ref 0.20–1.20)
BUN: 110.7 mg/dL — AB (ref 7.0–26.0)
CO2: 24 meq/L (ref 22–29)
Calcium: 8.6 mg/dL (ref 8.4–10.4)
Chloride: 88 mEq/L — ABNORMAL LOW (ref 98–109)
Creatinine: 4.7 mg/dL (ref 0.7–1.3)
EGFR: 11 mL/min/{1.73_m2} — AB (ref >90)
GLUCOSE: 112 mg/dL (ref 70–140)
Potassium: 5 mEq/L (ref 3.5–5.1)
SODIUM: 125 meq/L — AB (ref 136–145)
TOTAL PROTEIN: 5.8 g/dL — AB (ref 6.4–8.3)

## 2014-09-16 LAB — CBC WITH DIFFERENTIAL/PLATELET
BASO%: 0.2 % (ref 0.0–2.0)
Basophils Absolute: 0 10*3/uL (ref 0.0–0.1)
EOS ABS: 0.1 10*3/uL (ref 0.0–0.5)
EOS%: 1.1 % (ref 0.0–7.0)
HCT: 24.5 % — ABNORMAL LOW (ref 38.4–49.9)
HGB: 7.8 g/dL — ABNORMAL LOW (ref 13.0–17.1)
LYMPH%: 8.3 % — AB (ref 14.0–49.0)
MCH: 25.2 pg — ABNORMAL LOW (ref 27.2–33.4)
MCHC: 31.8 g/dL — ABNORMAL LOW (ref 32.0–36.0)
MCV: 79 fL — AB (ref 79.3–98.0)
MONO#: 0.9 10*3/uL (ref 0.1–0.9)
MONO%: 7.4 % (ref 0.0–14.0)
NEUT%: 83 % — ABNORMAL HIGH (ref 39.0–75.0)
NEUTROS ABS: 10.4 10*3/uL — AB (ref 1.5–6.5)
Platelets: 280 10*3/uL (ref 140–400)
RBC: 3.1 10*6/uL — AB (ref 4.20–5.82)
RDW: 17.8 % — AB (ref 11.0–14.6)
WBC: 12.5 10*3/uL — AB (ref 4.0–10.3)
lymph#: 1 10*3/uL (ref 0.9–3.3)

## 2014-09-16 LAB — HOLD TUBE, BLOOD BANK

## 2014-09-16 LAB — ABO/RH: ABO/RH(D): AB POS

## 2014-09-16 MED ORDER — SODIUM CHLORIDE 0.9 % IV SOLN
INTRAVENOUS | Status: DC
  Administered 2014-09-16: 17:00:00 via INTRAVENOUS

## 2014-09-16 NOTE — Progress Notes (Signed)
No images are attached to the encounter. No scans are attached to the encounter. No scans are attached to the encounter. [facility] SHARED VISIT PROGRESS NOTE  Laurey Morale, MD [place] [address]  DIAGNOSIS: Lung cancer   Staging form: Lung, AJCC 7th Edition     Clinical stage from 09/01/2014: Stage IV (T4, N3, M1b) - Signed by Curt Bears, MD on 09/01/2014       Staging comments: Sarcomatoid carcinoma  PRIOR THERAPY: none  CURRENT THERAPY: Systemic chemotherapy with carboplatin AUC 5 and paclitaxel 175 mg/m2 given every 3 weeks. First cycle expected 09/10/2014  INTERVAL HISTORY: MARQUAVIUS SCAIFE 80 y.o. male returns for a scheduled regular office visit for followup of of his recently diagnosed  Stage IV non small cell lung cancer. He is accompanied by his daughtersHe was to have started his systemic chemotherapy but was recently hospitalized for dehydration. He was initially on Lasix 80 mg twice daily then discharged on 40 mg twice daily. He still feels weak from his recent admission. He does not feel up to starting chemotherapy today. He and his daughters state that Mr Ashby only has one kidney. He is anemic today. Patient states his blood type is AB positive.   MEDICAL HISTORY: Past Medical History  Diagnosis Date  . CAD (coronary artery disease)     sees Dr. Dani Gobble Croitoru   . Hypertension   . Hyperlipidemia   . Rheumatic fever "I was an infant"  . Hyperglycemia   . BPH (benign prostatic hyperplasia)     sees Dr. Junious Silk   . Heart murmur dx'd 1958  . Type II diabetes mellitus dx'd ~ 04/2014    "mild; advised by MD to lose weight; no RX" (07/30/2014)  . History of gout   . Kidney carcinoma 1999    sees Dr. Junious Silk   . Malnutrition related to chronic disease 08/21/2014    ALLERGIES:  is allergic to amoxicillin.  MEDICATIONS:  Current Outpatient Prescriptions  Medication Sig Dispense Refill  . aspirin EC 81 MG tablet Take 81 mg by  mouth every evening.    . ferrous sulfate 325 (65 FE) MG tablet TAKE 1 TABLET BY MOUTH ONCE DAILY WITH BREAKFAST 30 tablet 11  . fish oil-omega-3 fatty acids 1000 MG capsule Take 2 g by mouth daily.      . furosemide (LASIX) 40 MG tablet Take 2 tablets (80 mg total) by mouth 2 (two) times daily. (Patient taking differently: Take 40 mg by mouth daily. ) 90 tablet 3  . hydrocortisone 2.5 % cream Apply topically 2 (two) times daily. (Patient not taking: Reported on 09/01/2014) 30 g 0  . levofloxacin (LEVAQUIN) 750 MG tablet Take 1 tablet (750 mg total) by mouth daily. 5 tablet 0  . lisinopril (PRINIVIL,ZESTRIL) 5 MG tablet Take 1 tablet (5 mg total) by mouth daily. 30 tablet 5  . metoprolol tartrate (LOPRESSOR) 25 MG tablet Take 25 mg by mouth 2 (two) times daily.      . Multiple Vitamin (MULTIVITAMIN) tablet Take 1 tablet by mouth daily.      . Multiple Vitamins-Minerals (PRESERVISION/LUTEIN) CAPS Take 2 capsules by mouth daily.     . prochlorperazine (COMPAZINE) 10 MG tablet Take 1 tablet (10 mg total) by mouth every 6 (six) hours as needed for nausea or vomiting. (Patient not taking: Reported on 09/01/2014) 30 tablet 0  . simvastatin (ZOCOR) 20 MG tablet Take 20 mg by mouth at bedtime.      Marland Kitchen  Tamsulosin HCl (FLOMAX) 0.4 MG CAPS Take 0.4 mg by mouth daily.    . vitamin C (ASCORBIC ACID) 500 MG tablet Take 1,000 mg by mouth daily.      Current Facility-Administered Medications  Medication Dose Route Frequency Provider Last Rate Last Dose  . 0.9 %  sodium chloride infusion   Intravenous Continuous [place] Adam, PA-C        SURGICAL HISTORY:  Past Surgical History  Procedure Laterality Date  . Coronary artery bypass graft  1991    CABG X 3; per Dr. Redmond Pulling   . Nephrectomy Left 1999     per Dr. Hessie Diener   . Vasectomy    . Tonsillectomy and adenoidectomy    . Colonoscopy  2010    clear, repeat in 10 yrs   . US echocardiography  07/14/2009    mod-severe LVH,EF =>55%,LA mildly  dilated,mild mitral & aortic annular ca+, AOV mod. sclerotic,discrete nodular thickening of the non-coronary cusp  . Cataract extraction w/ intraocular lens  implant, bilateral Bilateral 2000's  . Cardiac catheterization  1991  . Video bronchoscopy Bilateral 08/04/2014    Procedure: VIDEO BRONCHOSCOPY WITH FLUORO;  Surgeon: Collene Gobble, MD;  Location: [facility];  Service: Cardiopulmonary;  Laterality: Bilateral;    REVIEW OF SYSTEMS:  Review of Systems  Constitutional: Positive for malaise/fatigue. Negative for fever, chills, weight loss and diaphoresis.  HENT: Negative for congestion, ear discharge, ear pain, hearing loss, nosebleeds, sore throat and tinnitus.   Eyes: Negative for blurred vision, double vision, photophobia, pain, discharge and redness.  Respiratory: Negative for cough, hemoptysis, sputum production, shortness of breath, wheezing and stridor.   Cardiovascular: Negative for chest pain, palpitations, orthopnea, claudication, leg swelling and PND.  Gastrointestinal: Negative for heartburn, nausea, vomiting, abdominal pain, diarrhea, constipation, blood in stool and melena.  Genitourinary: Negative.   Musculoskeletal: Negative.   Skin: Negative.   Neurological: Positive for weakness. Negative for dizziness, tingling, focal weakness, seizures and headaches.  Endo/Heme/Allergies: Does not bruise/bleed easily.  Psychiatric/Behavioral: Negative for depression. The patient is not nervous/anxious and does not have insomnia.      PHYSICAL EXAMINATION: Physical Exam  Constitutional: He is oriented to person, place, and time and well-developed, well-nourished, and in no distress.  Comfortably seated in a wheelchair, appears weak  HENT:  Head: Normocephalic and atraumatic.  Mouth/Throat: Oropharynx is clear and moist.  Eyes: Pupils are equal, round, and reactive to light.  Neck: Normal range of motion. Neck supple. No JVD present. No tracheal deviation present. No thyromegaly  present.  Cardiovascular: Normal rate, regular rhythm, normal heart sounds and intact distal pulses.  Exam reveals no gallop and no friction rub.   No murmur heard. Pulmonary/Chest: Effort normal and breath sounds normal. No respiratory distress. He has no wheezes. He has no rales.  Abdominal: Soft. Bowel sounds are normal. He exhibits no distension and no mass. There is no tenderness.  Musculoskeletal: Normal range of motion. He exhibits edema. He exhibits no tenderness.  1+-2+ edema bilateral upper and lower extremities  Lymphadenopathy:    He has no cervical adenopathy.  Neurological: He is alert and oriented to person, place, and time. He has normal reflexes. Gait normal.  Skin: Skin is warm and dry. No rash noted.    ECOG PERFORMANCE STATUS: 2 - Symptomatic, <50% confined to bed  Blood pressure 82/39, pulse 79, temperature 98 F (36.7 C), temperature source Oral, resp. rate 22, height '5\' 9"'$  (1.753 m), SpO2 95 %.  LABORATORY DATA: Lab  Results  Component Value Date   WBC 12.5* 09/16/2014   HGB 7.8* 09/16/2014   HCT 24.5* 09/16/2014   MCV 79.0* 09/16/2014   PLT 280 09/16/2014      Chemistry      Component Value Date/Time   NA 125* 09/16/2014 1504   NA 134* 09/06/2014 0948   K 5.0 09/16/2014 1504   K 3.9 09/06/2014 0948   CL 98* 09/06/2014 0948   CO2 24 09/16/2014 1504   CO2 28 09/06/2014 0948   BUN 110.7* 09/16/2014 1504   BUN 39* 09/06/2014 0948   CREATININE 4.7* 09/16/2014 1504   CREATININE 1.44* 09/06/2014 0948   CREATININE 1.51* 07/07/2014 1121      Component Value Date/Time   CALCIUM 8.6 09/16/2014 1504   CALCIUM 9.1 09/06/2014 0948   ALKPHOS 52 09/16/2014 1504   ALKPHOS 58 09/04/2014 0600   AST 70* 09/16/2014 1504   AST 69* 09/04/2014 0600   ALT 45 09/16/2014 1504   ALT 36 09/04/2014 0600   BILITOT 0.32 09/16/2014 1504   BILITOT 0.5 09/04/2014 0600       RADIOGRAPHIC STUDIES:  Dg Chest 1 View  08/19/2014   CLINICAL DATA:  Status post biopsy left  lower lobe mass  EXAM: CHEST  1 VIEW  COMPARISON:  Chest radiograph August 04, 2014; chest CT July 31, 2014  FINDINGS: No pneumothorax. There is mass with consolidation throughout the left lower lobe region. There is atelectatic change throughout the left upper lobe. The right lung is clear. Heart size is upper normal with pulmonary vascular within normal limits. Patient is status post internal mammary bypass grafting. No adenopathy appreciable.  IMPRESSION: No pneumothorax. Mass with consolidation on the left, essentially stable. Right lung clear. No change in cardiac silhouette.   Electronically Signed   By: Lowella Grip III M.D.   On: 08/19/2014 13:50   Dg Chest 2 View  09/04/2014   CLINICAL DATA:  Shortness of breath.  EXAM: CHEST  2 VIEW  COMPARISON:  09/01/2014  FINDINGS: There is essentially complete opacification of the left hemithorax, a worsening from the prior exam.  Median sternotomy noted. Mild airway thickening is suggested in the right lung. Left cardiac border and left mediastinal border obscured by opacity.  IMPRESSION: 1. Progressive and essentially complete opacification of the left hemithorax.   Electronically Signed   By: Van Clines M.D.   On: 09/04/2014 11:35   Dg Chest 2 View  09/01/2014   CLINICAL DATA:  Altered mental status. SOB. No fever. Pt takes HTN meds. Borderline diabetic. Quit smoking in 1975. Smoked for 20 years prior to quitting  EXAM: CHEST  2 VIEW  COMPARISON:  08/19/2014  FINDINGS: Left lower to mid lung zone opacity reflects a combination of the known left lower lobe central mass and postobstructive atelectasis/consolidation. There is also a loculated pleural fluid on the left as well as hazy parenchymal opacity and irregular interstitial thickening. These findings are similar to the most recent prior exam.  Right lung is clear.  No right pleural effusion.  There are changes from previous cardiac surgery, stable. Cardiac silhouette is partly obscured by the  contiguous left lung and pleural opacity. It appears mildly enlarged but stable.  No pneumothorax.  Multiple left upper quadrant vascular clips, stable.  Bony thorax is grossly intact.  IMPRESSION: 1. No significant change from the recent prior study. 2. Persistent findings on the left related to the left lower lobe central mass and postobstructive left lower lobe atelectasis/consolidation as  well as loculated areas of left pleural fluid and areas of irregular interstitial thickening.   Electronically Signed   By: Lajean Manes M.D.   On: 09/01/2014 17:52   Mr Jeri Cos YN Contrast  08/27/2014   CLINICAL DATA:  80 year old male with recent diagnosis of stage IV lung cancer. Staging. Confusion, difficulty walking. Subsequent encounter.  EXAM: MRI HEAD WITHOUT AND WITH CONTRAST  TECHNIQUE: Multiplanar, multiecho pulse sequences of the brain and surrounding structures were obtained without and with intravenous contrast.  CONTRAST:  33m MULTIHANCE GADOBENATE DIMEGLUMINE 529 MG/ML IV SOLN  COMPARISON:  PET-CT 08/14/2014  FINDINGS: No midline shift, mass effect, or evidence of intracranial mass lesion. No abnormal enhancement identified. No dural thickening identified.  Visualized bone marrow signal is within normal limits, mild degenerative sclerosis at the tip of the odontoid.  Cerebral volume is within normal limits for age. No restricted diffusion to suggest acute infarction. No ventriculomegaly, extra-axial collection or acute intracranial hemorrhage. Cervicomedullary junction and pituitary are within normal limits. Major intracranial vascular flow voids are within normal limits.  Mild for age cerebral white matter T2 and FLAIR hyperintensity, including involvement at the posterior left corona radiata which most resembles a chronic lacunar infarct. Deep gray matter nuclei, brainstem and cerebellum are within normal limits for age.  Visible internal auditory structures appear normal. Mastoids and paranasal sinuses  are clear. Normal orbits soft tissues aside from postoperative changes to the globes. Negative scalp soft tissues. Negative visualized cervical spinal cord.  IMPRESSION: 1.  No acute or metastatic intracranial abnormality. 2. Mild for age chronic small vessel disease.   Electronically Signed   By: HGenevie AnnM.D.   On: 08/27/2014 09:15   Ct Biopsy  08/19/2014   CLINICAL DATA:  LARGE HYPERMETABOLIC LEFT LOWER LOBE MASS  EXAM: CT GUIDED CORE BIOPSY OF LEFT LOWER LOBE MASS  ANESTHESIA/SEDATION: 0.5  Mg IV Versed; 25 mcg IV Fentanyl  Total Moderate Sedation Time: 15 minutes.  PROCEDURE: The procedure risks, benefits, and alternatives were explained to the patient. Questions regarding the procedure were encouraged and answered. The patient understands and consents to the procedure.  The LEFT POSTERIOR BACK was prepped with Betadinein a sterile fashion, and a sterile drape was applied covering the operative field. A sterile gown and sterile gloves were used for the procedure. Local anesthesia was provided with 1% Lidocaine.  Previous imaging reviewed. Patient positioned left side down decubitus. Noncontrast localization CT performed. The large left lower lobe mass was localized with CT. Under sterile conditions and local anesthesia, a 17 gauge 6.8 cm access needle was advanced from a posterior intercostal approach into the lesion along the posterior margin. Needle position confirmed with CT. 18 gauge core biopsies obtained. Samples placed in formalin. Needle tract embolized with a biosentry device.  Complications: None immediate  FINDINGS: Imaging confirms needle placement into the large left lower lobe mass from a posterior approach for core biopsy  IMPRESSION: Successful CT-guided left lower lobe mass 18 gauge core biopsy   Electronically Signed   By: MJerilynn Mages  Shick M.D.   On: 08/19/2014 13:05   Dg Chest Port 1 View  09/07/2014   CLINICAL DATA:  Shortness of breath.  Stage IV lung carcinoma.  EXAM: PORTABLE CHEST - 1 VIEW   COMPARISON:  09/04/2014  FINDINGS: Persistent confluent opacity seen in the left lower lung field, with improved aeration noted in the left upper lobe. Left pleural effusion again noted. Right lung remains grossly clear. Cardiomegaly stable.  IMPRESSION: Improved aeration  noted in left upper lung field. Persistent opacification of left lower lung field and left pleural effusion.   Electronically Signed   By: Earle Gell M.D.   On: 09/07/2014 11:00     ASSESSMENT/PLAN:  No problem-specific assessment & plan notes found for this encounter. The patient is a pleasant 80 year old caucasian male recently diagnosed with stage IV non small cell lung cancer. Chemotherapy has not started yet due to the patient's deconditioned/debilitated state. His creatinine was 1.4 two weeks ago but today is 4.7, the hemoglobin is 7.8. The patient was discussed with Dr. Julien Nordmann. We will arrange to give him 1 liter of normal saline to address his renal insufficiency. We will also obtain a type and cross for 2 units od packed red blood cells if the blood bank is able to obtain compatible units. We will postpone the initialtion of systemic chemotherapy for the next 2 weeks and have him return for re-evaluation.  Awilda Metro E, PA-C 09/16/2014     All questions were answered. The patient knows to call the clinic with any problems, questions or concerns. We can certainly see the patient much sooner if necessary.

## 2014-09-16 NOTE — Patient Instructions (Signed)
Dehydration, Adult Dehydration is when you lose more fluids from the body than you take in. Vital organs like the kidneys, brain, and heart cannot function without a proper amount of fluids and salt. Any loss of fluids from the body can cause dehydration.  CAUSES   Vomiting.  Diarrhea.  Excessive sweating.  Excessive urine output.  Fever. SYMPTOMS  Mild dehydration  Thirst.  Dry lips.  Slightly dry mouth. Moderate dehydration  Very dry mouth.  Sunken eyes.  Skin does not bounce back quickly when lightly pinched and released.  Dark urine and decreased urine production.  Decreased tear production.  Headache. Severe dehydration  Very dry mouth.  Extreme thirst.  Rapid, weak pulse (more than 100 beats per minute at rest).  Cold hands and feet.  Not able to sweat in spite of heat and temperature.  Rapid breathing.  Blue lips.  Confusion and lethargy.  Difficulty being awakened.  Minimal urine production.  No tears. DIAGNOSIS  Your caregiver will diagnose dehydration based on your symptoms and your exam. Blood and urine tests will help confirm the diagnosis. The diagnostic evaluation should also identify the cause of dehydration. TREATMENT  Treatment of mild or moderate dehydration can often be done at home by increasing the amount of fluids that you drink. It is best to drink small amounts of fluid more often. Drinking too much at one time can make vomiting worse. Refer to the home care instructions below. Severe dehydration needs to be treated at the hospital where you will probably be given intravenous (IV) fluids that contain water and electrolytes. HOME CARE INSTRUCTIONS   Ask your caregiver about specific rehydration instructions.  Drink enough fluids to keep your urine clear or pale yellow.  Drink small amounts frequently if you have nausea and vomiting.  Eat as you normally do.  Avoid:  Foods or drinks high in sugar.  Carbonated  drinks.  Juice.  Extremely hot or cold fluids.  Drinks with caffeine.  Fatty, greasy foods.  Alcohol.  Tobacco.  Overeating.  Gelatin desserts.  Wash your hands well to avoid spreading bacteria and viruses.  Only take over-the-counter or prescription medicines for pain, discomfort, or fever as directed by your caregiver.  Ask your caregiver if you should continue all prescribed and over-the-counter medicines.  Keep all follow-up appointments with your caregiver. SEEK MEDICAL CARE IF:  You have abdominal pain and it increases or stays in one area (localizes).  You have a rash, stiff neck, or severe headache.  You are irritable, sleepy, or difficult to awaken.  You are weak, dizzy, or extremely thirsty. SEEK IMMEDIATE MEDICAL CARE IF:   You are unable to keep fluids down or you get worse despite treatment.  You have frequent episodes of vomiting or diarrhea.  You have blood or green matter (bile) in your vomit.  You have blood in your stool or your stool looks black and tarry.  You have not urinated in 6 to 8 hours, or you have only urinated a small amount of very dark urine.  You have a fever.  You faint. MAKE SURE YOU:   Understand these instructions.  Will watch your condition.  Will get help right away if you are not doing well or get worse. Document Released: 01/03/2005 Document Revised: 03/28/2011 Document Reviewed: 08/23/2010 ExitCare Patient Information 2015 [employer]. This information is not intended to replace advice given to you by your health care provider. Make sure you discuss any questions you have with your health care   provider.  

## 2014-09-17 ENCOUNTER — Telehealth: Payer: Self-pay | Admitting: *Deleted

## 2014-09-17 ENCOUNTER — Encounter: Payer: Self-pay | Admitting: *Deleted

## 2014-09-17 DIAGNOSIS — F419 Anxiety disorder, unspecified: Secondary | ICD-10-CM | POA: Diagnosis not present

## 2014-09-17 LAB — PREPARE RBC (CROSSMATCH)

## 2014-09-17 NOTE — Telephone Encounter (Signed)
Oncology Nurse Navigator Documentation  Oncology Nurse Navigator Flowsheets 09/17/2014  Navigator Encounter Type Telephone/I received a vm message from daughter today. I called her back.  Apparently, patient is not doing well.  His BP is low and nursing facility is taking care of this.  She stated he will not be here for appt tomorrow.  I will update scheduling staff and Dr. Julien Nordmann. I asked that she call me if needed.   Patient Visit Type Follow-up  Education -  Interventions Other  Time Spent with Patient 15

## 2014-09-18 ENCOUNTER — Ambulatory Visit: Payer: Medicare Other | Admitting: Physician Assistant

## 2014-09-18 ENCOUNTER — Telehealth: Payer: Self-pay | Admitting: Internal Medicine

## 2014-09-18 ENCOUNTER — Other Ambulatory Visit: Payer: Medicare Other

## 2014-09-18 DIAGNOSIS — R0601 Orthopnea: Secondary | ICD-10-CM | POA: Diagnosis not present

## 2014-09-18 NOTE — Telephone Encounter (Signed)
Per staff pt sick...appointments cx.

## 2014-09-19 ENCOUNTER — Telehealth: Payer: Self-pay | Admitting: Physician Assistant

## 2014-09-19 NOTE — Telephone Encounter (Signed)
per [facility] to sch pt trmt on 9/14-MW alreadt sch-sch lab & MD-cld & left pt a message and adv appt time & date

## 2014-09-19 NOTE — Patient Instructions (Signed)
Keep appointments for labs and infusions as scheduled Follow up in 2 weeks for re-evaluation

## 2014-09-20 LAB — TYPE AND SCREEN
ABO/RH(D): AB POS
ANTIBODY SCREEN: NEGATIVE
UNIT DIVISION: 0
UNIT DIVISION: 0

## 2014-09-26 ENCOUNTER — Telehealth: Payer: Self-pay | Admitting: *Deleted

## 2014-09-26 ENCOUNTER — Telehealth: Payer: Self-pay | Admitting: Cardiovascular Disease

## 2014-09-26 ENCOUNTER — Telehealth: Payer: Self-pay | Admitting: Internal Medicine

## 2014-09-26 NOTE — Telephone Encounter (Signed)
Information passed on to Dr. Sallyanne Kuster.

## 2014-09-26 NOTE — Telephone Encounter (Signed)
Pt daughter [name] called to inform Dr. Sarajane Jews that the patient died on 13-Oct-2014. Patient daughter contact # [phone] if there is any additional questions. Please informed MD. Thanks

## 2014-09-26 NOTE — Telephone Encounter (Signed)
PT.'S DAUGHTER WANTED TO NOTIFY HER FATHER'S PHYSICIAN.

## 2014-09-26 NOTE — Telephone Encounter (Signed)
cx appt per Marcie Bal pt is deceased

## 2014-09-26 NOTE — Telephone Encounter (Signed)
Dr. Sarajane Jews is aware.

## 2014-09-26 NOTE — Telephone Encounter (Signed)
Pt's daughter called in stating that the pt passed on 11-Oct-2022.

## 2014-09-30 NOTE — Telephone Encounter (Signed)
Can this encounter be closed?

## 2014-10-01 ENCOUNTER — Ambulatory Visit: Payer: Medicare Other | Admitting: Physician Assistant

## 2014-10-01 ENCOUNTER — Ambulatory Visit: Payer: Medicare Other

## 2014-10-01 ENCOUNTER — Ambulatory Visit: Payer: Medicare Other | Admitting: Cardiovascular Disease

## 2014-10-01 ENCOUNTER — Other Ambulatory Visit: Payer: Medicare Other

## 2014-10-18 DEATH — deceased

## 2015-07-06 ENCOUNTER — Other Ambulatory Visit: Payer: Self-pay | Admitting: Nurse Practitioner

## 2015-07-07 ENCOUNTER — Other Ambulatory Visit: Payer: Self-pay | Admitting: Nurse Practitioner

## 2016-07-15 IMAGING — DX DG CHEST 2V
2 series · 2 of 2 positions shown · non-contrast
Comparison: Report from 07/04/2002

CLINICAL DATA: Shortness of breath.

EXAM:
CHEST  2 VIEW

[chest pa]
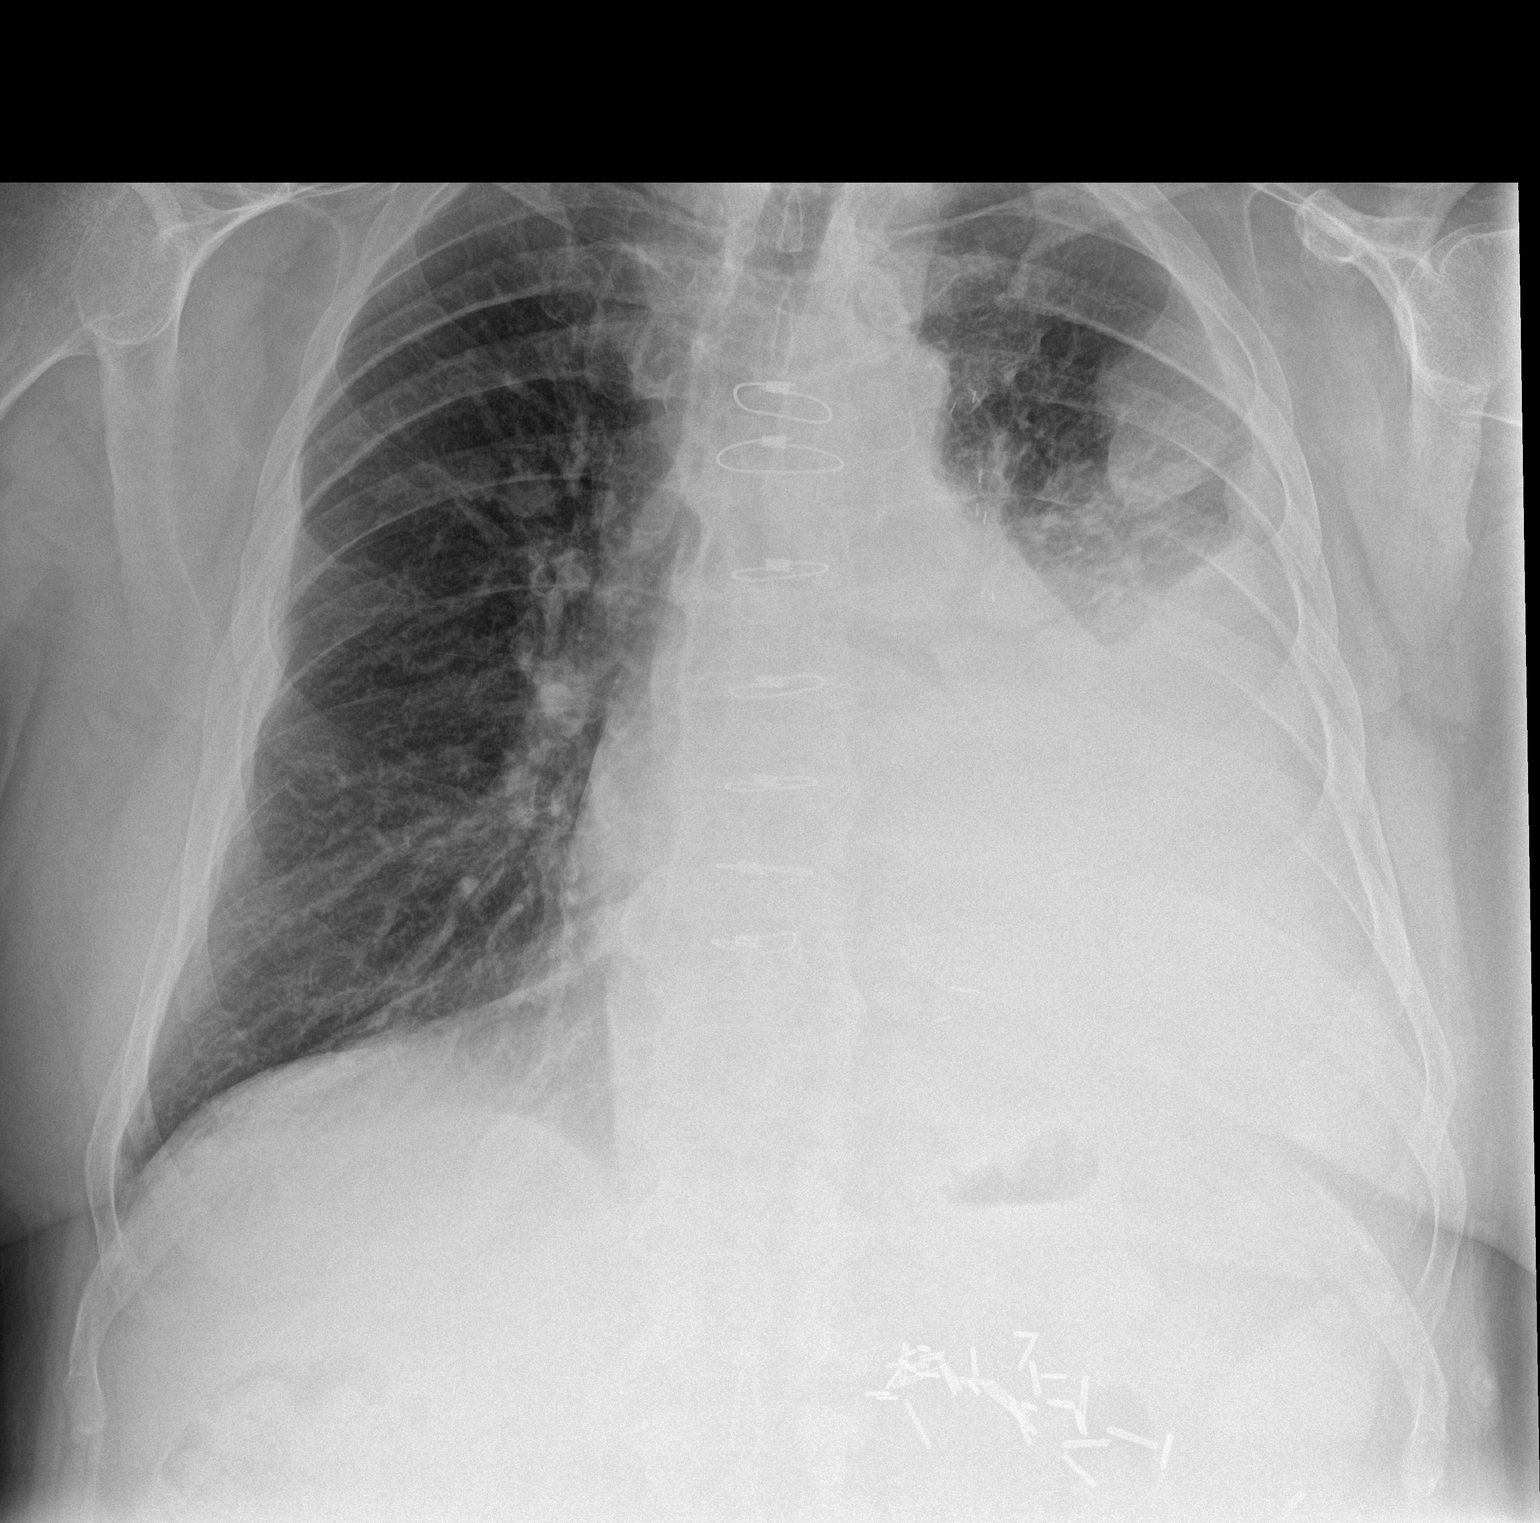

[chest lat]
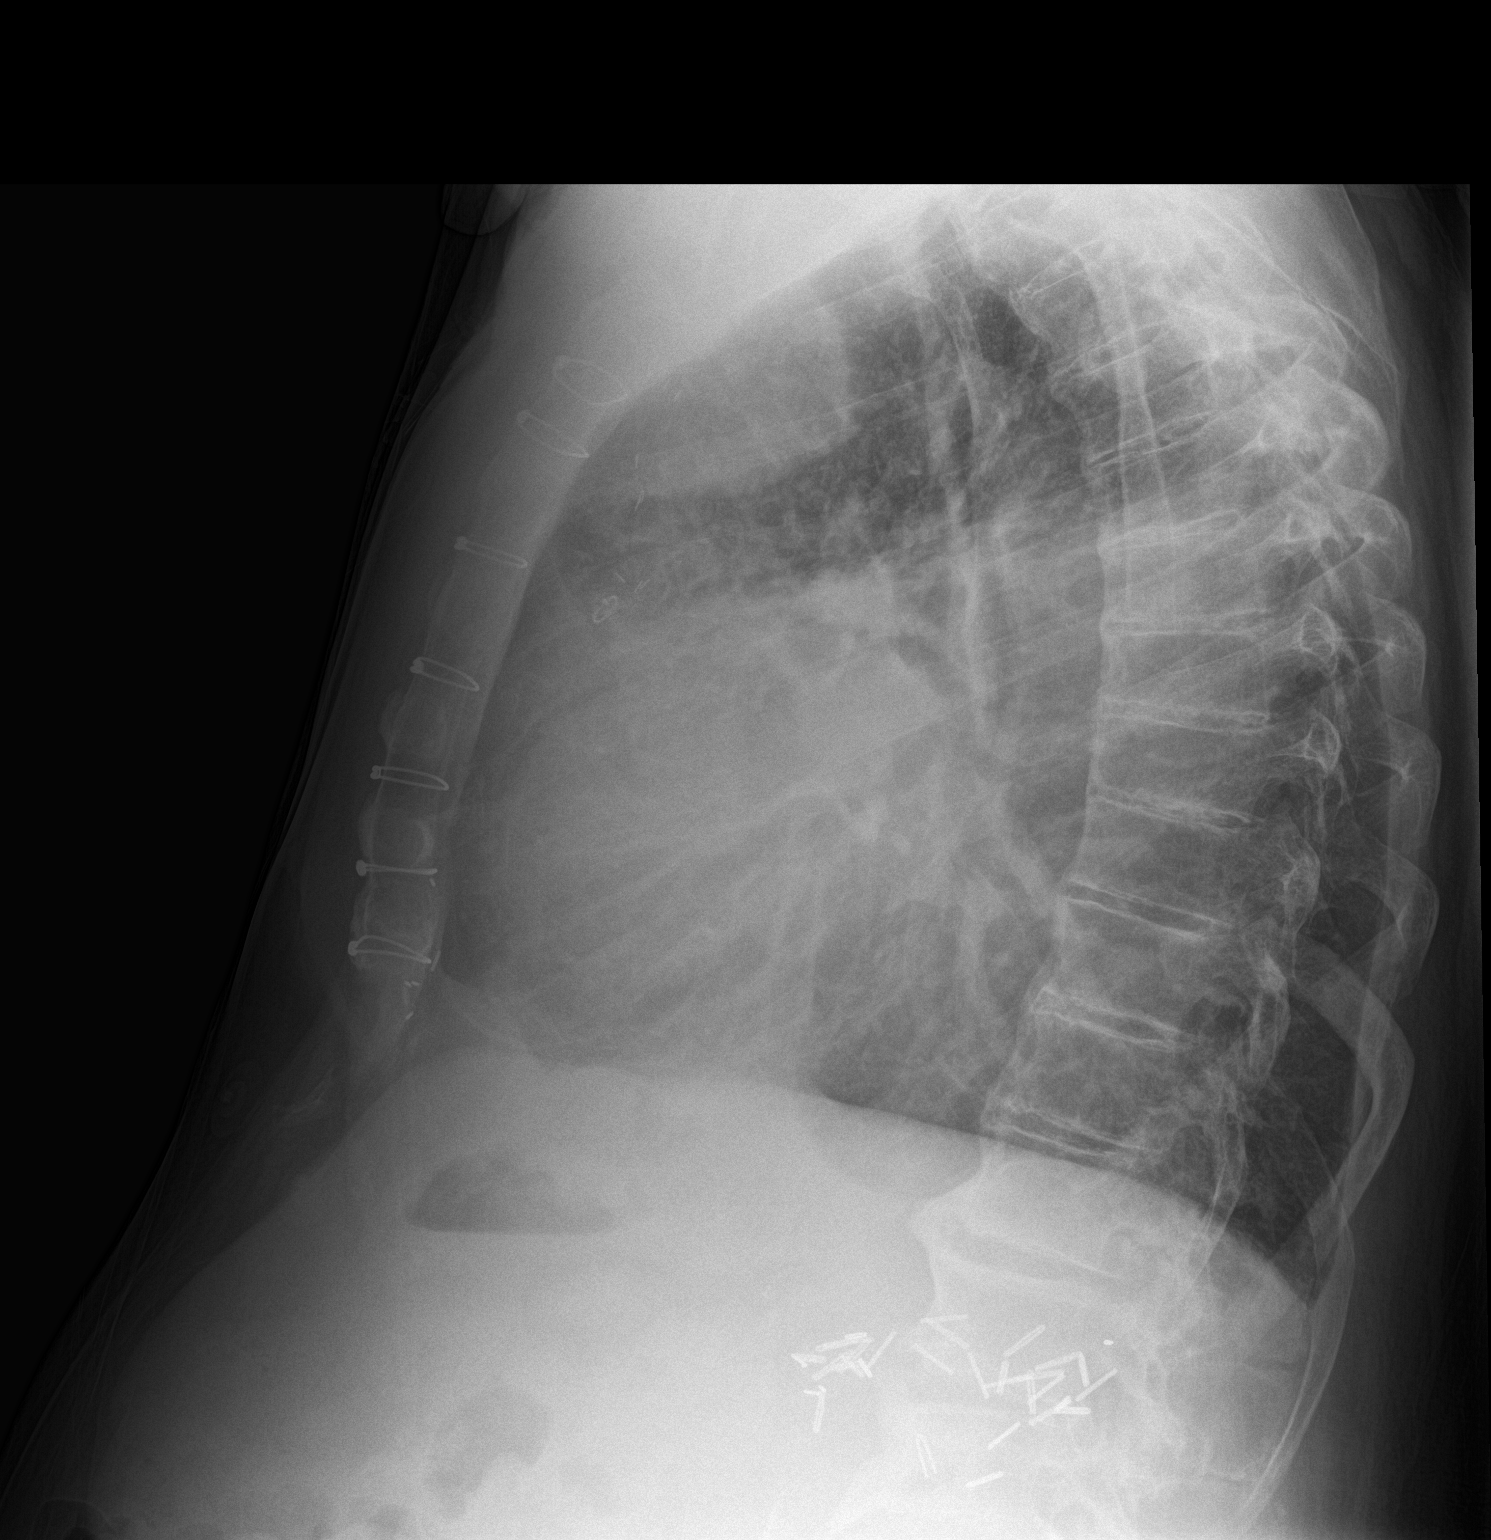

[2 of 2 positions shown; findings below may reference images not displayed]

FINDINGS: Large left pleural effusion occupying 75% of left hemithoracic
volume. Multiple old left rib deformities.

Prior CABG. Atherosclerotic aortic arch. Suspected enlargement of
the cardiopericardial silhouette (left heart border obscured).

The right lung appears clear.  Thoracic spondylosis noted.
IMPRESSION: 1. Large left pleural effusion with passive atelectasis. The
effusion occupies about 75% of left hemithoracic volume.
2. Suspected enlargement of the cardiopericardial silhouette but no
overt edema. Prior CABG.
3. Atherosclerotic aortic arch.

## 2016-07-16 IMAGING — CT CT CHEST W/O CM
1 of 3 series · 3 of 36 positions shown, 4 images · non-contrast
Comparison: Chest radiograph dated 07/31/2014

CLINICAL DATA: Evaluate pleural effusion

EXAM:
CT CHEST WITHOUT CONTRAST
TECHNIQUE: Multidetector CT imaging of the chest was performed following the
standard protocol without IV contrast.

[Series 204: coronal · coronal · 0.45mm/px · 3 of 98 slices shown, 4 images]
[im 20/98  mediastinal]
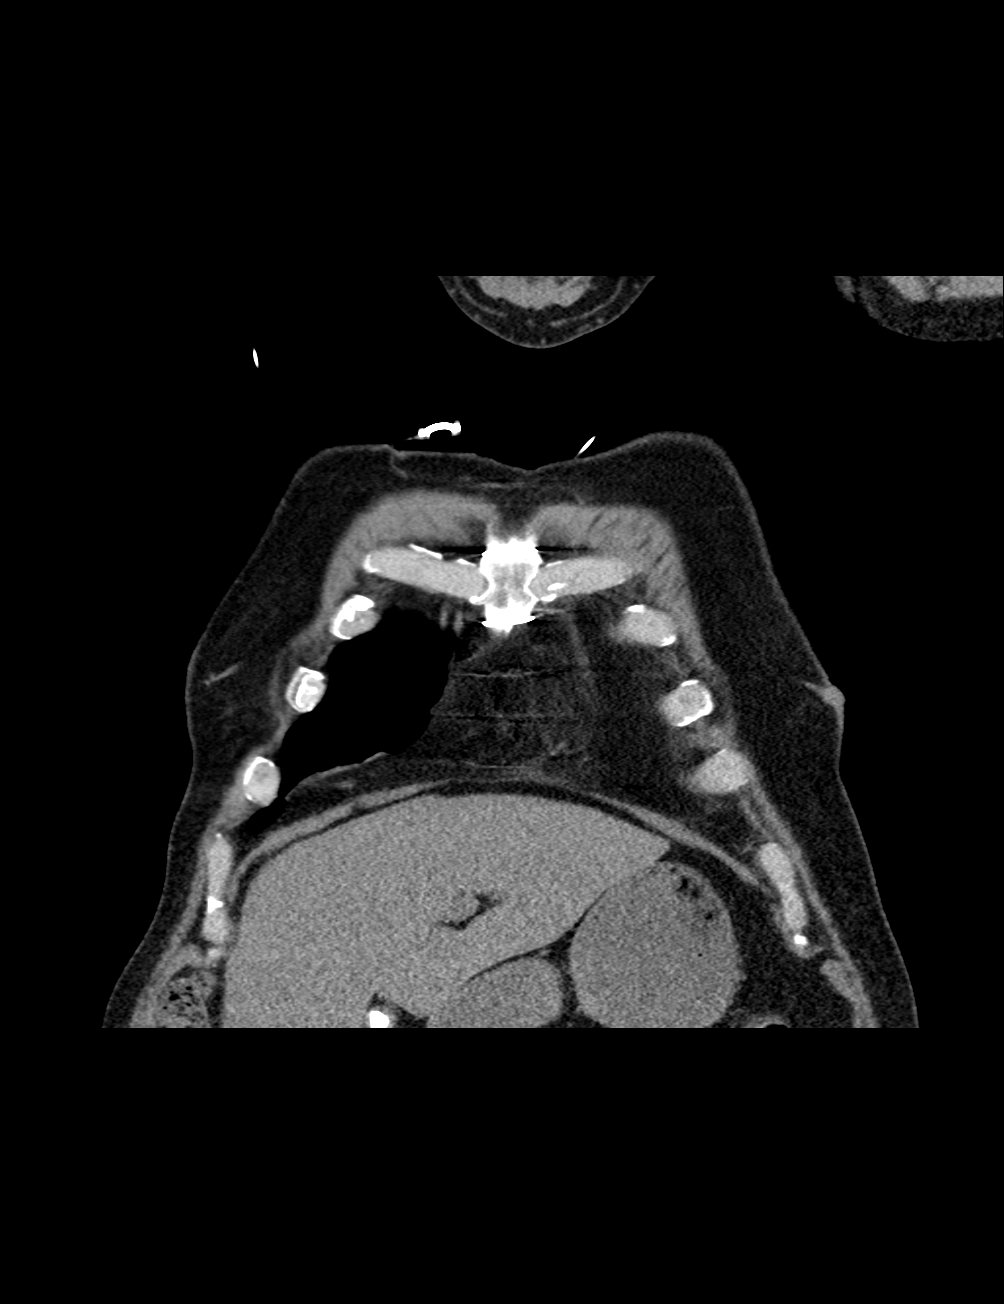
[im 20/98  lung]
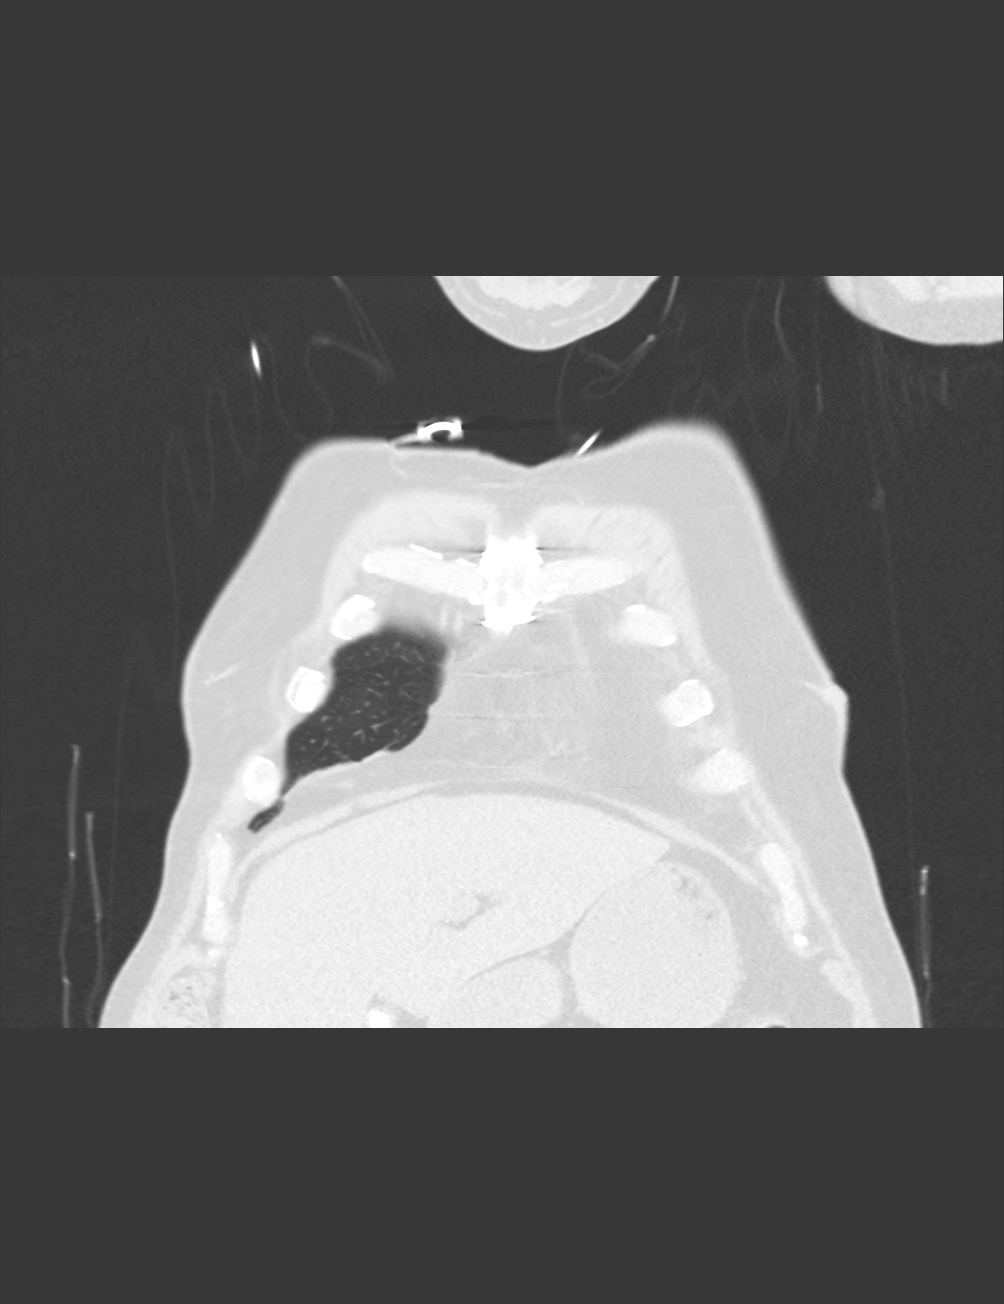
[im 59/98  lung]
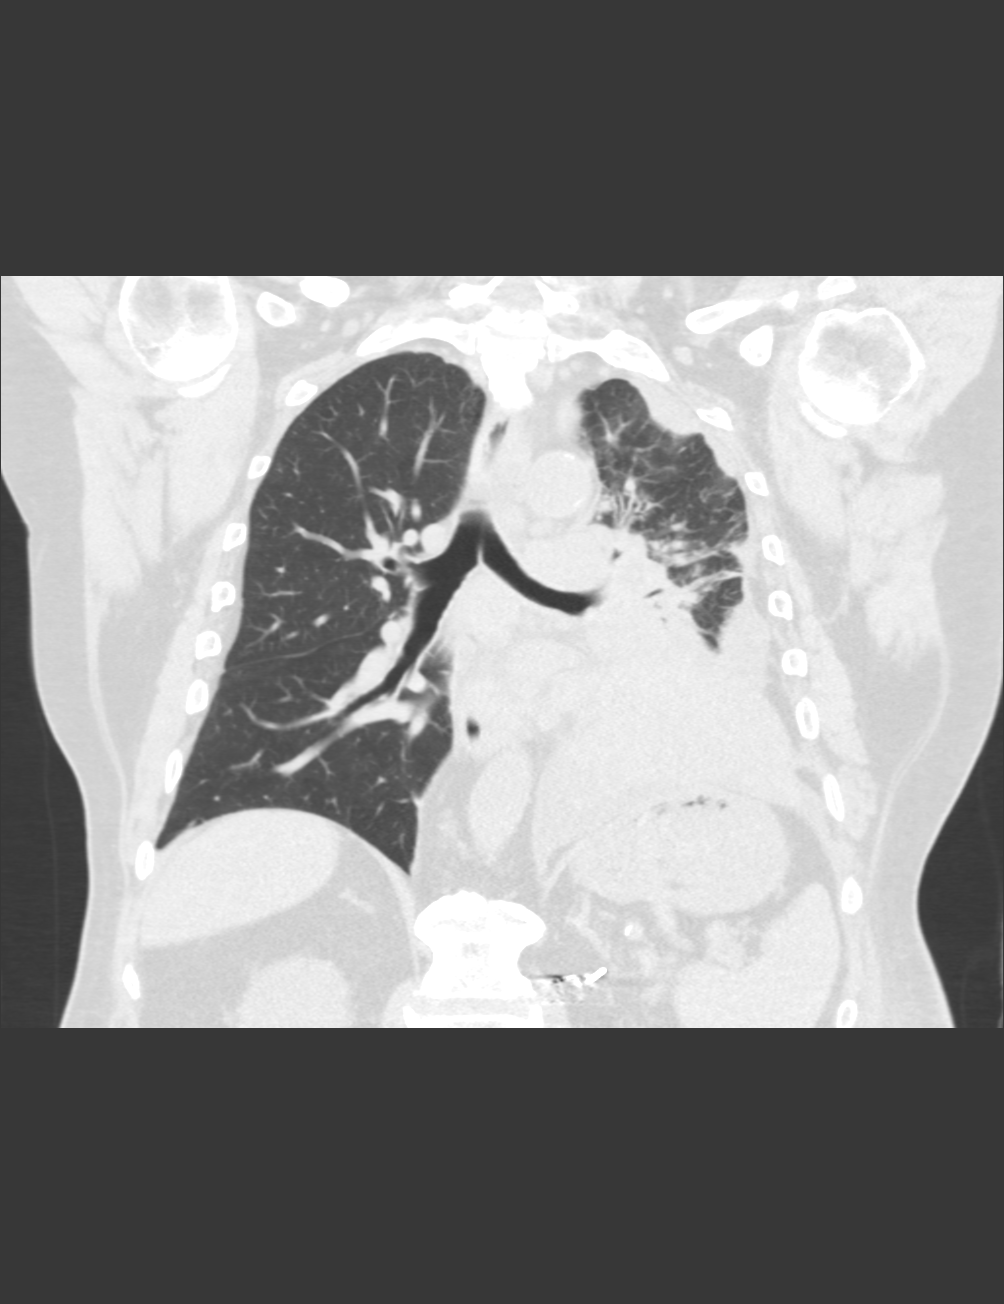
[im 78/98  lung]
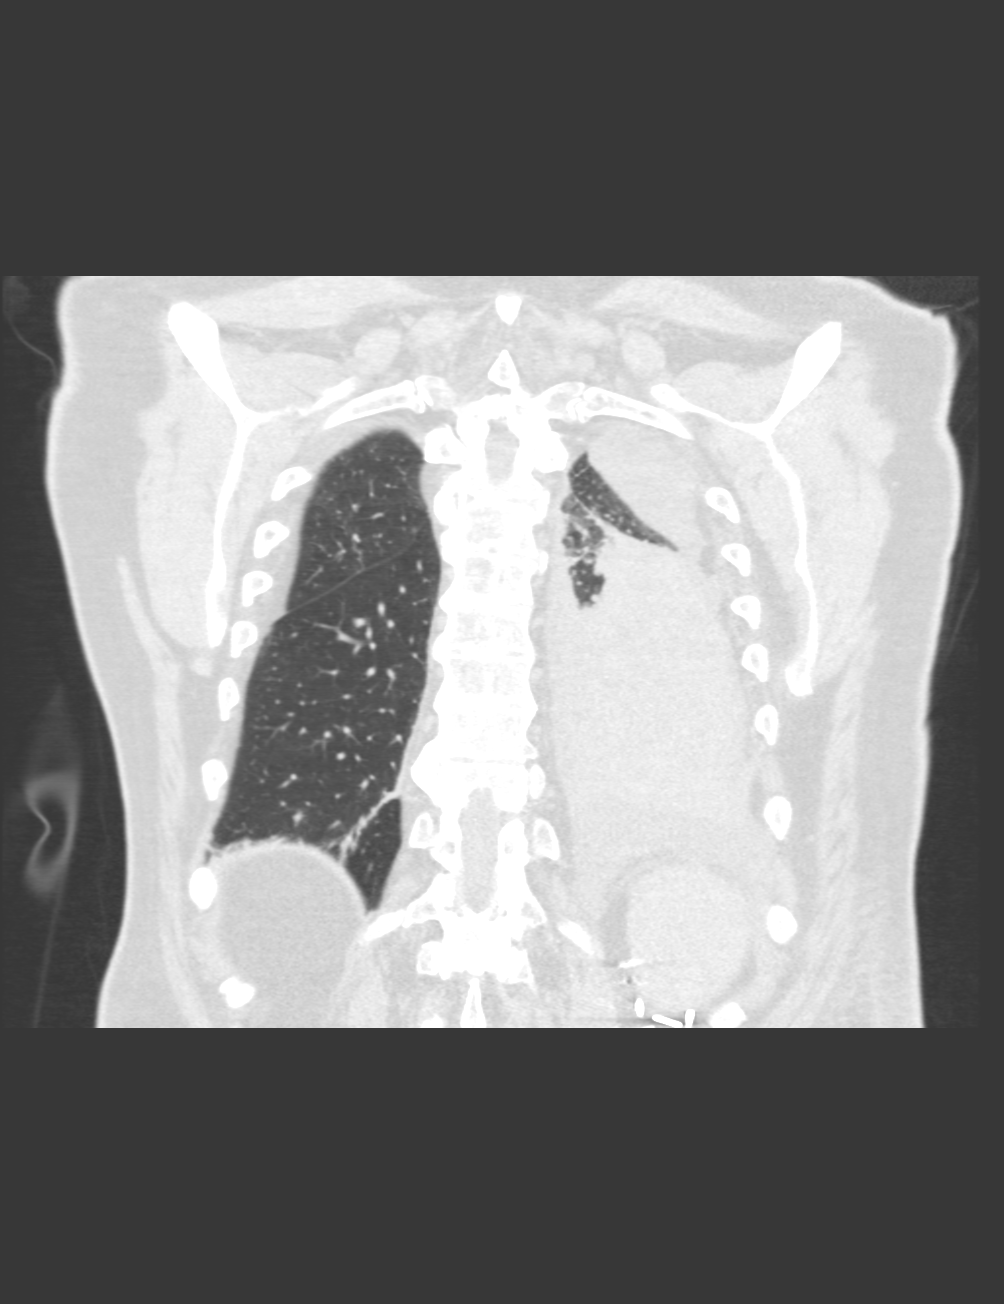

[3 of 36 positions shown; findings below may reference images not displayed]

FINDINGS: Mediastinum/Nodes: Heart is normal in size. No pericardial effusion.

Coronary atherosclerosis. Postsurgical changes related to prior
CABG.

Atherosclerotic calcifications of the aortic arch.

Thoracic lymphadenopathy, including:

--14 mm short axis node at the left thoracic inlet (series 21/image
13)

--11 mm short axis AP window node (series 21/image 23)

--13 mm short axis prevascular node (series 21/image 25)

--Abnormal soft tissue in the left hilar region (series 21/image 28)

--17 mm short axis subcarinal node (series 21/image 31)

2.3 cm left inferior thyroid nodule (series 21/image 13).

Lungs/Pleura: 11.3 x 8.8 cm left lower lobe mass (series 21/ image
42) extending to the left infrahilar/ perihilar region.

Associated small to moderate loculated left pleural effusion, likely
malignant.

Mild branching nodularity in the posterior right upper lobe (series
203/images 24 and 25), suspicious for endobronchial spread of
infection or tumor.

No pneumothorax status post thoracentesis.

Upper abdomen: Visualized upper abdomen is notable for two nodes at
the GE junction measuring up to 14 mm short axis (series 201/ image
49), cholelithiasis, and surgical clips in the left upper abdomen.

Musculoskeletal: Degenerative changes of the visualized
thoracolumbar spine.
IMPRESSION: 11.3 x 8.8 cm left lower lobe mass extending to the left infrahilar/
perihilar region, compatible with primary bronchogenic neoplasm.

Associated small to moderate loculated left pleural effusion, likely
malignant.

Mild branching nodularity in the posterior right upper lobe,
suspicious for endobronchial spread of infection or tumor.

Thoracic and upper abdominal nodal metastases, as above.

No pneumothorax.
